# Patient Record
Sex: Male | Born: 1965 | Race: Black or African American | Hispanic: No | Marital: Married | Smoking: Never smoker
Health system: Southern US, Community
[De-identification: ages and names within clinical notes are randomized; demographics above are authoritative.]

## PROBLEM LIST (undated history)

## (undated) DIAGNOSIS — I219 Acute myocardial infarction, unspecified: Secondary | ICD-10-CM

## (undated) DIAGNOSIS — I2699 Other pulmonary embolism without acute cor pulmonale: Secondary | ICD-10-CM

## (undated) DIAGNOSIS — I82409 Acute embolism and thrombosis of unspecified deep veins of unspecified lower extremity: Secondary | ICD-10-CM

## (undated) DIAGNOSIS — E7800 Pure hypercholesterolemia, unspecified: Secondary | ICD-10-CM

## (undated) DIAGNOSIS — G4730 Sleep apnea, unspecified: Secondary | ICD-10-CM

## (undated) DIAGNOSIS — I10 Essential (primary) hypertension: Secondary | ICD-10-CM

## (undated) HISTORY — PX: TONSILLECTOMY: SUR1361

## (undated) HISTORY — PX: OTHER SURGICAL HISTORY: SHX169

## (undated) HISTORY — PX: IVC FILTER INSERTION: CATH118245

## (undated) HISTORY — PX: APPENDECTOMY: SHX54

---

## 2018-02-03 ENCOUNTER — Encounter: Payer: Self-pay | Admitting: Emergency Medicine

## 2018-02-03 ENCOUNTER — Emergency Department
Admission: EM | Admit: 2018-02-03 | Discharge: 2018-02-03 | Disposition: A | Discharge status: Home / Self Care | Payer: Federal, State, Local not specified - PPO | Attending: Emergency Medicine | Admitting: Emergency Medicine

## 2018-02-03 ENCOUNTER — Emergency Department: Payer: Federal, State, Local not specified - PPO

## 2018-02-03 ENCOUNTER — Other Ambulatory Visit: Payer: Self-pay

## 2018-02-03 DIAGNOSIS — R2241 Localized swelling, mass and lump, right lower limb: Secondary | ICD-10-CM | POA: Diagnosis not present

## 2018-02-03 DIAGNOSIS — Z7901 Long term (current) use of anticoagulants: Secondary | ICD-10-CM | POA: Insufficient documentation

## 2018-02-03 DIAGNOSIS — R112 Nausea with vomiting, unspecified: Secondary | ICD-10-CM

## 2018-02-03 DIAGNOSIS — R197 Diarrhea, unspecified: Secondary | ICD-10-CM | POA: Diagnosis not present

## 2018-02-03 DIAGNOSIS — Z79899 Other long term (current) drug therapy: Secondary | ICD-10-CM | POA: Insufficient documentation

## 2018-02-03 DIAGNOSIS — R1032 Left lower quadrant pain: Secondary | ICD-10-CM | POA: Insufficient documentation

## 2018-02-03 HISTORY — DX: Acute embolism and thrombosis of unspecified deep veins of unspecified lower extremity: I82.409

## 2018-02-03 LAB — URINALYSIS, COMPLETE (UACMP) WITH MICROSCOPIC
BILIRUBIN URINE: NEGATIVE
Bacteria, UA: NONE SEEN
GLUCOSE, UA: NEGATIVE mg/dL
KETONES UR: NEGATIVE mg/dL
LEUKOCYTES UA: NEGATIVE
NITRITE: NEGATIVE
PH: 5 (ref 5.0–8.0)
PROTEIN: 30 mg/dL — AB
SQUAMOUS EPITHELIAL / LPF: NONE SEEN
Specific Gravity, Urine: 1.012 (ref 1.005–1.030)
WBC, UA: NONE SEEN WBC/hpf (ref 0–5)

## 2018-02-03 LAB — COMPREHENSIVE METABOLIC PANEL
ALT: 17 U/L (ref 17–63)
AST: 29 U/L (ref 15–41)
Albumin: 4.1 g/dL (ref 3.5–5.0)
Alkaline Phosphatase: 120 U/L (ref 38–126)
Anion gap: 10 (ref 5–15)
BILIRUBIN TOTAL: 1.3 mg/dL — AB (ref 0.3–1.2)
BUN: 14 mg/dL (ref 6–20)
CHLORIDE: 109 mmol/L (ref 101–111)
CO2: 20 mmol/L — ABNORMAL LOW (ref 22–32)
CREATININE: 1.22 mg/dL (ref 0.61–1.24)
Calcium: 9.2 mg/dL (ref 8.9–10.3)
GFR calc Af Amer: >60 mL/min (ref >60)
Glucose, Bld: 122 mg/dL — ABNORMAL HIGH (ref 65–99)
Potassium: 3.6 mmol/L (ref 3.5–5.1)
Sodium: 139 mmol/L (ref 135–145)
TOTAL PROTEIN: 8.1 g/dL (ref 6.5–8.1)

## 2018-02-03 LAB — CBC
HCT: 46.6 % (ref 40.0–52.0)
Hemoglobin: 15.4 g/dL (ref 13.0–18.0)
MCH: 28.6 pg (ref 26.0–34.0)
MCHC: 33 g/dL (ref 32.0–36.0)
MCV: 86.7 fL (ref 80.0–100.0)
PLATELETS: 222 10*3/uL (ref 150–440)
RBC: 5.38 MIL/uL (ref 4.40–5.90)
RDW: 14.5 % (ref 11.5–14.5)
WBC: 7.8 10*3/uL (ref 3.8–10.6)

## 2018-02-03 LAB — LIPASE, BLOOD: LIPASE: 23 U/L (ref 11–51)

## 2018-02-03 LAB — TROPONIN I: Troponin I: <0.03 ng/mL (ref <0.03)

## 2018-02-03 MED ORDER — ONDANSETRON HCL 4 MG PO TABS: 4.0000 mg | ORAL_TABLET | Freq: Three times a day (TID) | ORAL | 0 refills | Status: AC | PRN

## 2018-02-03 MED ORDER — IOPAMIDOL (ISOVUE-300) INJECTION 61%
30.0000 mL | Freq: Once | INTRAVENOUS | Status: AC | PRN
  Administered 2018-02-03: 30 mL via ORAL

## 2018-02-03 MED ORDER — IOPAMIDOL (ISOVUE-300) INJECTION 61%
125.0000 mL | Freq: Once | INTRAVENOUS | Status: AC | PRN
  Administered 2018-02-03: 125 mL via INTRAVENOUS

## 2018-02-03 MED ORDER — SODIUM CHLORIDE 0.9 % IV BOLUS
1000.0000 mL | Freq: Once | INTRAVENOUS | Status: AC
  Administered 2018-02-03: 1000 mL via INTRAVENOUS

## 2018-02-03 MED ORDER — DICYCLOMINE HCL 10 MG PO CAPS: 10.0000 mg | ORAL_CAPSULE | Freq: Three times a day (TID) | ORAL | 0 refills | Status: DC | PRN

## 2018-02-03 MED ORDER — ONDANSETRON HCL 4 MG/2ML IJ SOLN
4.0000 mg | Freq: Once | INTRAMUSCULAR | Status: AC
  Administered 2018-02-03: 4 mg via INTRAVENOUS
  Filled 2018-02-03: qty 2

## 2018-02-03 MED ORDER — DICYCLOMINE HCL 10 MG PO CAPS
10.0000 mg | ORAL_CAPSULE | Freq: Once | ORAL | Status: AC
  Administered 2018-02-03: 10 mg via ORAL
  Filled 2018-02-03: qty 1

## 2018-02-03 NOTE — ED Notes (Signed)
Pt stuck x2 by this RN and pt's wife requesting pt have IV team come to stick pt and is refusing to allow any other nurses besides IV team to come stick patient.

## 2018-02-03 NOTE — ED Triage Notes (Signed)
Pt to c/o N/V/D since yesterday. Pt states that he doesn't have any control over his bowels and that he is unable to keep anything down. Pt in NAD at this time.

## 2018-02-03 NOTE — Discharge Instructions (Addendum)
You may take the nausea medicine and the Bentyl (for abdominal cramping) as needed over the next few days to help your symptoms.  Gradually advance your diet over the next few days.  Return to the emergency department for new, worsening, persistent severe vomiting, inability to tolerate anything by mouth, persistent diarrhea or abdominal pain, fevers, weakness, any blood in the stool, or any other new or worsening symptoms that concern you.  Your CT scan shows a small lesion in the L5 bone in your lower back which is most likely a hemangioma, in other words and abnormal blood vessel.  You should let your regular doctor know about this finding, so that you can have a follow-up scan to monitor it.

## 2018-02-03 NOTE — ED Notes (Signed)
Pt discharged to home.  Family member driving.  Discharge instructions reviewed.  Verbalized understanding.  No questions or concerns at this time.  Teach back verified.  Pt in NAD.  No items left in ED.   

## 2018-02-03 NOTE — ED Provider Notes (Signed)
[facility] Emergency Department Provider Note ____________________________________________   First MD Initiated Contact with Patient 02/03/18 1614     (approximate)  I have reviewed the triage vital signs and the nursing notes.   HISTORY  Chief Complaint Emesis and Diarrhea    HPI Benjamin Delacruz is a 52 y.o. male with past medical history of DVT who presents with diarrhea since yesterday, described as watery, multiple episodes, nonbloody, and associated with nausea and multiple episodes of vomiting.  Patient also reports some associated left-sided abdominal pain.  The patient incidentally also mentions that he has had some increased right leg swelling for the last few days.  He has not been able to take his Xarelto yesterday or today.  He reports mild shortness of breath but denies chest pain.  No sick contacts, recent hospitalization, or unusual foods.   Past Medical History:  Diagnosis Date  . DVT (deep venous thrombosis) ([place])     There are no active problems to display for this patient.   Past Surgical History:  Procedure Laterality Date  . IVC FILTER INSERTION      Prior to Admission medications   Medication Sig Start Date End Date Taking? Authorizing Provider  hydrochlorothiazide (HYDRODIURIL) 25 MG tablet Take 25 mg by mouth daily.   Yes [provider]  metoprolol tartrate (LOPRESSOR) 50 MG tablet Take 50 mg by mouth 2 (two) times daily. 12/23/14  Yes [provider]  rivaroxaban (XARELTO) 20 MG TABS tablet Take 20 mg by mouth daily. 07/16/17  Yes [provider]  dicyclomine (BENTYL) 10 MG capsule Take 1 capsule (10 mg total) by mouth 3 (three) times daily as needed for up to 4 days for spasms. 02/03/18 02/07/18  Arta Silence, MD  ondansetron (ZOFRAN) 4 MG tablet Take 1 tablet (4 mg total) by mouth every 8 (eight) hours as needed for up to 5 days for nausea or vomiting. 02/03/18 02/08/18  Arta Silence, MD     Allergies Patient has no known allergies.  No family history on file.  Social History Social History   Tobacco Use  . Smoking status: Never Smoker  . Smokeless tobacco: Never Used  Substance Use Topics  . Alcohol use: Not Currently  . Drug use: Not Currently    Review of Systems  Constitutional: Positive for chills. Eyes: No redness. ENT: No sore throat. Cardiovascular: Denies chest pain. Respiratory: Positive for mild shortness of breath. Gastrointestinal: Positive for nausea, vomiting, and diarrhea.  Genitourinary: Negative for dysuria.  Musculoskeletal: Negative for back pain. Skin: Negative for rash. Neurological: Negative for headache.   ____________________________________________   PHYSICAL EXAM:  VITAL SIGNS: ED Triage Vitals  Enc Vitals Group     BP 02/03/18 1525 (!) 147/83     Pulse Rate 02/03/18 1525 95     Resp 02/03/18 1525 16     Temp 02/03/18 1528 99.6 F (37.6 C)     Temp Source 02/03/18 1525 Oral     SpO2 02/03/18 1525 96 %     Weight 02/03/18 1526 245 lb (111.1 kg)     Height 02/03/18 1526 5\' 7"  (1.702 m)     Head Circumference --      Peak Flow --      Pain Score 02/03/18 1526 5     Pain Loc --      Pain Edu? --      Excl. in [place]? --     Constitutional: Alert and oriented.  Relatively well-appearing and in  no acute distress. Eyes: Conjunctivae are normal.  No scleral icterus.  Slightly dry Head: Atraumatic. Nose: No congestion/rhinnorhea. Mouth/Throat: Mucous membranes are moist.   Neck: Normal range of motion.  Cardiovascular: Normal rate, regular rhythm. Grossly normal heart sounds.  Good peripheral circulation. Respiratory: Normal respiratory effort.  No retractions. Lungs CTAB. Gastrointestinal: Soft with mild left mid abdominal discomfort but no focal tenderness. No distention.  Genitourinary: No flank tenderness. Musculoskeletal:  Extremities warm and well perfused.  Mild right lower leg tenderness to  palpation. Neurologic:  Normal speech and language. No gross focal neurologic deficits are appreciated.  Skin:  Skin is warm and dry. No rash noted. Psychiatric: Mood and affect are normal. Speech and behavior are normal.  ____________________________________________   LABS (all labs ordered are listed, but only abnormal results are displayed)  Labs Reviewed  COMPREHENSIVE METABOLIC PANEL - Abnormal; Notable for the following components:      Result Value   CO2 20 (*)    Glucose, Bld 122 (*)    Total Bilirubin 1.3 (*)    All other components within normal limits  URINALYSIS, COMPLETE (UACMP) WITH MICROSCOPIC - Abnormal; Notable for the following components:   Color, Urine YELLOW (*)    APPearance CLEAR (*)    Hgb urine dipstick SMALL (*)    Protein, ur 30 (*)    All other components within normal limits  LIPASE, BLOOD  CBC  TROPONIN I   ____________________________________________  EKG  ED ECG REPORT I, Arta Silence, the attending physician, personally viewed and interpreted this ECG.  Date: 02/03/2018 EKG Time: 1803 Rate: 92 Rhythm: normal sinus rhythm QRS Axis: Borderline left axis Intervals: normal ST/T Wave abnormalities: T wave inversions inferior and lateral Narrative Interpretation: Nonspecific T wave abnormality; no prior EKG available for comparison  ____________________________________________  RADIOLOGY  CXR: No focal infiltrate or other acute findings  [place] RLE: No acute DVT  CT abdomen: liquid stool within the colon; no colitis, diverticulitis, or other acute findings.  Lesion in L5 likely hemangioma.  ____________________________________________   PROCEDURES  Procedure(s) performed: No  Procedures  Critical Care performed: No ____________________________________________   INITIAL IMPRESSION / ASSESSMENT AND PLAN / ED COURSE  Pertinent labs & imaging results that were available during my care of the patient were reviewed by me and  considered in my medical decision making (see chart for details).  52 year old male with past medical history as noted above presents with nausea, vomiting, diarrhea with some left-sided abdominal pain since yesterday.  He also incidentally mentions increased right lower extremity swelling over the last several days where he previously has had DVT.  Patient is on Xarelto but was unable to take it yesterday or today due to the vomiting.  He reports mild shortness of breath but denies any chest pain.  Per prior records, he has an IVC filter in place.  On exam, the vital signs are normal except for low-grade temperature, the patient is relatively comfortable appearing, the abdomen is soft with minimal left-sided discomfort, and the remainder of the exam is as described above.  Overall presentation is most consistent with viral gastroenteritis versus foodborne illness.  Lower suspicion for colitis.  We will give IV fluids, symptomatic treatment, and obtain labs.  Consider imaging if patient continues to have abdominal pain or there are concerning lab findings.  Patient reports increased right lower leg swelling where he previously had DVT.  He has generally been compliant with Xarelto.  Will obtain a DVT study and a chest  x-ray.  There is no clinical evidence for PE given no hypoxia, tachycardia, or chest pain, especially given the patient has an IVC filter.   ----------------------------------------- 8:10 PM on 02/03/2018 -----------------------------------------  Patient's x-ray and DVT study are negative.  Lab workup is unremarkable and his electrolytes are normal.  His EKG shows nonspecific lateral T wave inversions (no old EKG for comparison), but troponin is negative, and there is no clinical evidence for ACS.  UA is also negative.  On repeat exam the patient did have some left lower quadrant tenderness, so we discussed the further plan and the patient agreed with obtaining a CT to rule out  colitis.  CT shows no concerning acute findings.  Patient's symptoms have improved.  He feels well to go home.  CT did show a small lesion in L5 consistent with likely hemangioma.  Patient has known metastatic cancer history.  He has no pain there.  This finding does not appear to be of any urgent clinical significance.  I informed him of the finding and recommended follow-up with his PMD.  Return precautions given, the patient expresses understanding.     ____________________________________________   FINAL CLINICAL IMPRESSION(S) / ED DIAGNOSES  Final diagnoses:  Nausea vomiting and diarrhea      NEW MEDICATIONS STARTED DURING THIS VISIT:  New Prescriptions   DICYCLOMINE (BENTYL) 10 MG CAPSULE    Take 1 capsule (10 mg total) by mouth 3 (three) times daily as needed for up to 4 days for spasms.   ONDANSETRON (ZOFRAN) 4 MG TABLET    Take 1 tablet (4 mg total) by mouth every 8 (eight) hours as needed for up to 5 days for nausea or vomiting.     Note:  This document was prepared using Dragon voice recognition software and may include unintentional dictation errors.    Arta Silence, MD 02/03/18 2017

## 2018-09-30 ENCOUNTER — Emergency Department: Payer: Federal, State, Local not specified - PPO

## 2018-09-30 ENCOUNTER — Other Ambulatory Visit: Payer: Self-pay

## 2018-09-30 ENCOUNTER — Emergency Department
Admission: EM | Admit: 2018-09-30 | Discharge: 2018-09-30 | Disposition: A | Discharge status: Home / Self Care | Payer: Federal, State, Local not specified - PPO | Attending: Emergency Medicine | Admitting: Emergency Medicine

## 2018-09-30 ENCOUNTER — Encounter: Payer: Self-pay | Admitting: *Deleted

## 2018-09-30 DIAGNOSIS — R42 Dizziness and giddiness: Secondary | ICD-10-CM | POA: Insufficient documentation

## 2018-09-30 DIAGNOSIS — Z7901 Long term (current) use of anticoagulants: Secondary | ICD-10-CM | POA: Insufficient documentation

## 2018-09-30 DIAGNOSIS — Z79899 Other long term (current) drug therapy: Secondary | ICD-10-CM | POA: Insufficient documentation

## 2018-09-30 DIAGNOSIS — R112 Nausea with vomiting, unspecified: Secondary | ICD-10-CM | POA: Insufficient documentation

## 2018-09-30 LAB — BASIC METABOLIC PANEL
ANION GAP: 5 (ref 5–15)
BUN: 11 mg/dL (ref 6–20)
CALCIUM: 9.2 mg/dL (ref 8.9–10.3)
CO2: 26 mmol/L (ref 22–32)
Chloride: 114 mmol/L — ABNORMAL HIGH (ref 98–111)
Creatinine, Ser: 1.07 mg/dL (ref 0.61–1.24)
Glucose, Bld: 115 mg/dL — ABNORMAL HIGH (ref 70–99)
Potassium: 4.1 mmol/L (ref 3.5–5.1)
Sodium: 145 mmol/L (ref 135–145)

## 2018-09-30 LAB — CBC
HCT: 42.8 % (ref 39.0–52.0)
Hemoglobin: 14.6 g/dL (ref 13.0–17.0)
MCH: 29.3 pg (ref 26.0–34.0)
MCHC: 34.1 g/dL (ref 30.0–36.0)
MCV: 85.9 fL (ref 80.0–100.0)
NRBC: 0 % (ref 0.0–0.2)
PLATELETS: 237 10*3/uL (ref 150–400)
RBC: 4.98 MIL/uL (ref 4.22–5.81)
RDW: 13.2 % (ref 11.5–15.5)
WBC: 8.5 10*3/uL (ref 4.0–10.5)

## 2018-09-30 LAB — PROTIME-INR
INR: 0.97
PROTHROMBIN TIME: 12.8 s (ref 11.4–15.2)

## 2018-09-30 LAB — TROPONIN I

## 2018-09-30 MED ORDER — ONDANSETRON HCL 4 MG/2ML IJ SOLN
4.0000 mg | Freq: Once | INTRAMUSCULAR | Status: AC
  Administered 2018-09-30: 4 mg via INTRAVENOUS
  Filled 2018-09-30: qty 2

## 2018-09-30 MED ORDER — PROCHLORPERAZINE EDISYLATE 10 MG/2ML IJ SOLN
10.0000 mg | Freq: Once | INTRAMUSCULAR | Status: AC
  Administered 2018-09-30: 10 mg via INTRAVENOUS
  Filled 2018-09-30: qty 2

## 2018-09-30 MED ORDER — MECLIZINE HCL 25 MG PO TABS: 25.0000 mg | ORAL_TABLET | Freq: Three times a day (TID) | ORAL | 0 refills | Status: DC | PRN

## 2018-09-30 NOTE — ED Notes (Signed)
Pt to triage via wheelchair.  Pt reports dizziness and nausea.  Sx for 3 days.  States sx worse today.  No chest pain or sob.  Pt alert  Speech clear.

## 2018-09-30 NOTE — ED Provider Notes (Signed)
Evaluated at the bedside.  Patient with positional vertigo.  Now absent after Compazine.  Reassuring work-up.  Likely peripheral vertigo.  Agree with NP Triplett's assessment and plan.   Orbie Pyo, MD 09/30/18 2049

## 2018-09-30 NOTE — ED Notes (Signed)
E signature pad not working. Pt verbalized understanding of discharge instructions.

## 2018-09-30 NOTE — ED Provider Notes (Signed)
[facility] Emergency Department Provider Note  ____________________________________________   None    (approximate)  I have reviewed the triage vital signs and the nursing notes.   HISTORY  Chief Complaint Dizziness   HPI Benjamin Delacruz is a 52 y.o. male who presents to the emergency department for treatment and evaluation of dizziness and nausea for the past 3 days. Symptoms are worse today. He denies chest pain or shortness of breath. He has a history of DVT, otherwise healthy. He denies recent illness. Wife gave him a meclizine a couple of days ago which he believes helped, but dizziness returned upon awakening. Dizziness occurs with position change and he feels that the room is spinning.    Past Medical History:  Diagnosis Date  . DVT (deep venous thrombosis) ([place])     There are no active problems to display for this patient.   Past Surgical History:  Procedure Laterality Date  . IVC FILTER INSERTION      Prior to Admission medications   Medication Sig Start Date End Date Taking? Authorizing Provider  dicyclomine (BENTYL) 10 MG capsule Take 1 capsule (10 mg total) by mouth 3 (three) times daily as needed for up to 4 days for spasms. 02/03/18 02/07/18  Arta Silence, MD  hydrochlorothiazide (HYDRODIURIL) 25 MG tablet Take 25 mg by mouth daily.    [provider]  meclizine (MEDI-MECLIZINE) 25 MG tablet Take 1 tablet (25 mg total) by mouth 3 (three) times daily as needed for dizziness. 09/30/18   Amoria Mclees, Johnette Abraham B, FNP  metoprolol tartrate (LOPRESSOR) 50 MG tablet Take 50 mg by mouth 2 (two) times daily. 12/23/14   [provider]  rivaroxaban (XARELTO) 20 MG TABS tablet Take 20 mg by mouth daily. 07/16/17   [provider]    Allergies Patient has no known allergies.  No family history on file.  Social History Social History   Tobacco Use  . Smoking status: Never Smoker  . Smokeless tobacco: Never Used    Substance Use Topics  . Alcohol use: Not Currently  . Drug use: Not Currently    Review of Systems  Constitutional: No fever/chills Eyes: No visual changes. ENT: No sore throat. Cardiovascular: Denies chest pain. Respiratory: Denies shortness of breath. Gastrointestinal: No abdominal pain.  No nausea, no vomiting.  No diarrhea. Genitourinary: Negative for dysuria. Musculoskeletal: Negative for back pain. Skin: Negative for rash. Neurological: Negative for headaches, focal weakness or numbness. Positive for dizziness. ____________________________________________   PHYSICAL EXAM:  VITAL SIGNS: ED Triage Vitals  Enc Vitals Group     BP 09/30/18 1708 (!) 142/87     Pulse Rate 09/30/18 1708 73     Resp 09/30/18 1708 20     Temp 09/30/18 1708 98.6 F (37 C)     Temp Source 09/30/18 1708 Oral     SpO2 09/30/18 1708 99 %     Weight 09/30/18 1714 235 lb (106.6 kg)     Height 09/30/18 1714 5\' 7"  (1.702 m)     Head Circumference --      Peak Flow --      Pain Score --      Pain Loc --      Pain Edu? --      Excl. in [place]? --     Constitutional: Alert and oriented. Well appearing and in no acute distress. Eyes: Conjunctivae are normal. PERRL. EOMI. Head: Atraumatic. Nose: No congestion/rhinnorhea. Mouth/Throat: Mucous membranes are moist.  Oropharynx non-erythematous. Neck: No  stridor.   Cardiovascular: Normal rate, regular rhythm. Grossly normal heart sounds.  Good peripheral circulation. Respiratory: Normal respiratory effort.  No retractions. Lungs CTAB. Gastrointestinal: Soft and nontender. No distention. No abdominal bruits. No CVA tenderness. Musculoskeletal: No lower extremity tenderness nor edema.  No joint effusions. Neurologic:  Normal speech and language. No gross focal neurologic deficits are appreciated. No gait instability. No pronator drift. Observed ambulating with unassisted, steady gait. Skin:  Skin is warm, dry and intact. No rash noted. Psychiatric: Mood  and affect are normal. Speech and behavior are normal.  ____________________________________________   LABS (all labs ordered are listed, but only abnormal results are displayed)  Labs Reviewed  BASIC METABOLIC PANEL - Abnormal; Notable for the following components:      Result Value   Chloride 114 (*)    Glucose, Bld 115 (*)    All other components within normal limits  CBC  TROPONIN I  PROTIME-INR   ____________________________________________  EKG  ED ECG REPORT I, Brynnlee Cumpian, FNP-BC, personally viewed and interpreted this ECG.   Date: 09/30/2018  EKG Time: 1713  Rate: 74  Rhythm: normal EKG, normal sinus rhythm  Axis: left  Intervals:none  ST&T Change: No ST elevation  ____________________________________________  RADIOLOGY  ED MD interpretation:  CT head negative for acute findings.  Official radiology report(s): Dg Chest 2 View  Result Date: 09/30/2018 CLINICAL DATA:  Dizziness. Shortness of breath. Vomiting and nausea over the past 4 days. EXAM: CHEST - 2 VIEW COMPARISON:  Two-view chest x-ray 02/03/2018 FINDINGS: Heart size is normal. Lung volumes are low. There is mild edema superimposed on chronic interstitial coarsening. No significant airspace consolidation is present. Mild bibasilar atelectasis is present. No significant effusions are evident. Degenerative changes in the upper thoracic spine are stable. IMPRESSION: 1. Mild edema superimposed on chronic airspace disease. This could represent early congestive heart failure. 2. No focal airspace disease. Electronically Signed   By: San Morelle M.D.   On: 09/30/2018 18:12   Ct Head Wo Contrast  Result Date: 09/30/2018 CLINICAL DATA:  Vertigo, episodic, peripheral. Dizziness and nausea. Symptoms for 3 days. EXAM: CT HEAD WITHOUT CONTRAST TECHNIQUE: Contiguous axial images were obtained from the base of the skull through the vertex without intravenous contrast. COMPARISON:  None. FINDINGS: Brain: No  acute infarct, hemorrhage, or mass lesion is present. The ventricles are of normal size. No significant extraaxial fluid collection is present. The brainstem and cerebellum are normal. Vascular: No hyperdense vessel or unexpected calcification. Skull: Calvarium is intact. No focal lytic or blastic lesions are present. Sinuses/Orbits: The paranasal sinuses and mastoid air cells are clear. Globes and orbits are within normal limits. IMPRESSION: Negative CT of the head. Electronically Signed   By: San Morelle M.D.   On: 09/30/2018 20:12    ____________________________________________   PROCEDURES  Procedure(s) performed: None  Procedures  Critical Care performed: No  ____________________________________________   INITIAL IMPRESSION / ASSESSMENT AND PLAN / ED COURSE  As part of my medical decision making, I reviewed the following data within the electronic MEDICAL RECORD NUMBER    52 year old male presenting to the emergency department for treatment and evaluation of dizziness with nausea and vomiting.  Symptoms started 3 days ago and have progressively worsened.  Patient describes symptoms as room spinning with any position change.  While here in the emergency department, he was given Zofran without any relief of the nausea and vomiting.  He was then given Compazine with significant improvement of the dizziness and resolution  of the nausea and vomiting.  Once he felt better, he was requesting discharge.  CT scan of the head is normal and labs are all reassuring, therefore this seems to be a reasonable plan.  He will be given a prescription for meclizine and will follow-up with his primary care provider if symptoms return. He is to return to the ER for symptoms that change or worsen.       ____________________________________________   FINAL CLINICAL IMPRESSION(S) / ED DIAGNOSES  Final diagnoses:  Dizziness     ED Discharge Orders         Ordered    meclizine (MEDI-MECLIZINE)  25 MG tablet  3 times daily PRN     09/30/18 2034           Note:  This document was prepared using Dragon voice recognition software and may include unintentional dictation errors.    Victorino Dike, FNP 10/01/18 0201    Orbie Pyo, MD 10/08/18 2223

## 2018-09-30 NOTE — Discharge Instructions (Signed)
Please follow up with your primary care provider if not improving over the next few days.  Return to the ER for symptoms that change or worsen if unable to schedule an appointment.

## 2019-03-11 ENCOUNTER — Emergency Department
Admission: EM | Admit: 2019-03-11 | Discharge: 2019-03-11 | Disposition: A | Discharge status: Home / Self Care | Payer: Federal, State, Local not specified - PPO | Attending: Emergency Medicine | Admitting: Emergency Medicine

## 2019-03-11 ENCOUNTER — Encounter: Payer: Self-pay | Admitting: Radiology

## 2019-03-11 ENCOUNTER — Emergency Department: Payer: Federal, State, Local not specified - PPO

## 2019-03-11 ENCOUNTER — Other Ambulatory Visit: Payer: Self-pay

## 2019-03-11 DIAGNOSIS — Z7901 Long term (current) use of anticoagulants: Secondary | ICD-10-CM | POA: Insufficient documentation

## 2019-03-11 DIAGNOSIS — R0789 Other chest pain: Secondary | ICD-10-CM | POA: Diagnosis present

## 2019-03-11 DIAGNOSIS — Z79899 Other long term (current) drug therapy: Secondary | ICD-10-CM | POA: Insufficient documentation

## 2019-03-11 LAB — COMPREHENSIVE METABOLIC PANEL
ALT: 13 U/L (ref 0–44)
AST: 23 U/L (ref 15–41)
Albumin: 4.1 g/dL (ref 3.5–5.0)
Alkaline Phosphatase: 117 U/L (ref 38–126)
Anion gap: 8 (ref 5–15)
BUN: 13 mg/dL (ref 6–20)
CO2: 24 mmol/L (ref 22–32)
Calcium: 9 mg/dL (ref 8.9–10.3)
Chloride: 106 mmol/L (ref 98–111)
Creatinine, Ser: 1.21 mg/dL (ref 0.61–1.24)
GFR calc Af Amer: >60 mL/min (ref >60)
GFR calc non Af Amer: >60 mL/min (ref >60)
Glucose, Bld: 122 mg/dL — ABNORMAL HIGH (ref 70–99)
Potassium: 4.2 mmol/L (ref 3.5–5.1)
Sodium: 138 mmol/L (ref 135–145)
Total Bilirubin: 0.8 mg/dL (ref 0.3–1.2)
Total Protein: 7.5 g/dL (ref 6.5–8.1)

## 2019-03-11 LAB — CBC
HCT: 43.8 % (ref 39.0–52.0)
Hemoglobin: 14.6 g/dL (ref 13.0–17.0)
MCH: 28.8 pg (ref 26.0–34.0)
MCHC: 33.3 g/dL (ref 30.0–36.0)
MCV: 86.4 fL (ref 80.0–100.0)
Platelets: 222 10*3/uL (ref 150–400)
RBC: 5.07 MIL/uL (ref 4.22–5.81)
RDW: 13.2 % (ref 11.5–15.5)
WBC: 7.8 10*3/uL (ref 4.0–10.5)
nRBC: 0 % (ref 0.0–0.2)

## 2019-03-11 LAB — TROPONIN I: Troponin I: <0.03 ng/mL (ref <0.03)

## 2019-03-11 MED ORDER — ONDANSETRON HCL 4 MG/2ML IJ SOLN
4.0000 mg | Freq: Once | INTRAMUSCULAR | Status: AC
  Administered 2019-03-11: 17:00:00 4 mg via INTRAVENOUS
  Filled 2019-03-11: qty 2

## 2019-03-11 MED ORDER — DIPHENHYDRAMINE HCL 50 MG/ML IJ SOLN
50.0000 mg | Freq: Once | INTRAMUSCULAR | Status: AC
  Administered 2019-03-11: 50 mg via INTRAVENOUS
  Filled 2019-03-11: qty 1

## 2019-03-11 MED ORDER — HYDROCORTISONE NA SUCCINATE PF 100 MG IJ SOLR
200.0000 mg | Freq: Once | INTRAMUSCULAR | Status: AC
  Administered 2019-03-11: 200 mg via INTRAVENOUS
  Filled 2019-03-11: qty 4

## 2019-03-11 MED ORDER — IOHEXOL 350 MG/ML SOLN
75.0000 mL | Freq: Once | INTRAVENOUS | Status: AC | PRN
  Administered 2019-03-11: 75 mL via INTRAVENOUS

## 2019-03-11 NOTE — ED Triage Notes (Signed)
Pt to ED via EMS from home with c/o substernal chest pain. Pt has hx of blood clots and stent placement. Pt states he currently has known blood clots. Pt describes pain as chest tightness with dizziness, Pt took 1/2 nitro at home prior to EMS arrival. NAD.

## 2019-03-11 NOTE — ED Provider Notes (Signed)
[facility] Emergency Department Provider Note  Time seen: 5:17 PM  I have reviewed the triage vital signs and the nursing notes.   HISTORY  Chief Complaint Chest Pain   HPI Benjamin Delacruz is a 53 y.o. 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  History of PE in the past as well.  Patient states over the past 4 days he has been experiencing central left-sided chest pain sharp in nature worse with deep inspiration.  Patient states it feels somewhat similar to when he had a blood clot previously.  Patient also has a cardiac stent placed 8 years ago.  Denies any nausea or diaphoresis.  No shortness of breath cough or fever.  Overall patient appears very well at this time no distress.   Past Medical History:  Diagnosis Date  . DVT (deep venous thrombosis) ([place])     There are no active problems to display for this patient.   Past Surgical History:  Procedure Laterality Date  . IVC FILTER INSERTION      Prior to Admission medications   Medication Sig Start Date End Date Taking? Authorizing Provider  dicyclomine (BENTYL) 10 MG capsule Take 1 capsule (10 mg total) by mouth 3 (three) times daily as needed for up to 4 days for spasms. 02/03/18 02/07/18  Arta Silence, MD  hydrochlorothiazide (HYDRODIURIL) 25 MG tablet Take 25 mg by mouth daily.    [provider]  meclizine (MEDI-MECLIZINE) 25 MG tablet Take 1 tablet (25 mg total) by mouth 3 (three) times daily as needed for dizziness. 09/30/18   Triplett, Johnette Abraham B, FNP  metoprolol tartrate (LOPRESSOR) 50 MG tablet Take 50 mg by mouth 2 (two) times daily. 12/23/14   [provider]  rivaroxaban (XARELTO) 20 MG TABS tablet Take 20 mg by mouth daily. 07/16/17    [provider]    No Known Allergies  No family history on file.  Social History Social History   Tobacco Use  . Smoking status: Never Smoker  . Smokeless tobacco: Never Used  Substance Use Topics  . Alcohol use: Not Currently  . Drug use: Not Currently    Review of Systems Constitutional: Negative for fever. ENT: Negative for recent illness/congestion Cardiovascular: Intermittent central left-sided sharp chest pain worse with deep inspiration. Respiratory: Negative for shortness of breath.  Negative for cough. Gastrointestinal: Negative for abdominal pain, vomiting Musculoskeletal: Negative for musculoskeletal complaints Skin: Negative for skin complaints  Neurological: Negative for headache All other ROS negative  ____________________________________________   PHYSICAL EXAM:  VITAL SIGNS: ED Triage Vitals  Enc Vitals Group     BP 03/11/19 1714 137/85     Pulse Rate 03/11/19 1713 79     Resp 03/11/19 1714 16     Temp 03/11/19 1713 97.7 F (36.5 C)     Temp Source 03/11/19 1713 Oral     SpO2 03/11/19 1713 99 %     Weight 03/11/19 1711 243 lb (110.2 kg)     Height 03/11/19 1711 5\' 7"  (1.702 m)     Head Circumference --      Peak Flow --      Pain Score 03/11/19 1712 8     Pain Loc --  Pain Edu? --      Excl. in [place]? --    Constitutional: Alert and oriented. Well appearing and in no distress. Eyes: Normal exam ENT      Head: Normocephalic and atraumatic      Mouth/Throat: Mucous membranes are moist. Cardiovascular: Normal rate, regular rhythm.  Respiratory: Normal respiratory effort without tachypnea nor retractions. Breath sounds are clear Gastrointestinal: Soft and nontender. No distention.  Obese. Musculoskeletal: Nontender with normal range of motion in all extremities. No lower extremity tenderness or edema. Neurologic:  Normal speech and language. No gross focal neurologic deficits Skin:  Skin is warm, dry and intact.  Psychiatric: Mood  and affect are normal.  ____________________________________________    EKG  EKG viewed and interpreted by myself shows normal sinus rhythm at 79 bpm with a narrow QRS, normal axis, normal intervals, nonspecific but no concerning ST changes.  ____________________________________________    RADIOLOGY  CT scan of the chest is negative for PE.  ____________________________________________   INITIAL IMPRESSION / ASSESSMENT AND PLAN / ED COURSE  Pertinent labs & imaging results that were available during my care of the patient were reviewed by me and considered in my medical decision making (see chart for details).   Patient presents to the emergency department for chest pain.  States intermittent chest pain x4 days worse with deep inspiration, similar to prior PE.  Patient is on Xarelto 20 mg daily.  Denies missing any doses.  Patient denies any cough congestion or fever.  We will check labs, EKG, CTA of the chest to rule out PE.  Patient does state last 2 times he had CTAs of the chest he got very nauseated with the contrast.  We will dose hydrocortisone, Benadryl and Zofran 1 hour prior to CT imaging as a precaution.  Denies any shortness of breath hives or itching.  CTA negative for PE.  Lab work is reassuring.  Negative troponin.  Overall patient appears well, reassuring work-up and I believe the patient safe for discharge home with PCP follow-up.  Benjamin Delacruz was evaluated in Emergency Department on 03/11/2019 for the symptoms described in the history of present illness. He was evaluated in the context of the global COVID-19 pandemic, which necessitated consideration that the patient might be at risk for infection with the SARS-CoV-2 virus that causes COVID-19. Institutional protocols and algorithms that pertain to the evaluation of patients at risk for COVID-19 are in a state of rapid change based on information released by regulatory bodies including the CDC and federal and state  organizations. These policies and algorithms were followed during the patient's care in the ED.  ____________________________________________   FINAL CLINICAL IMPRESSION(S) / ED DIAGNOSES  Chest pain   Harvest Dark, MD 03/11/19 2045

## 2019-03-11 NOTE — ED Notes (Signed)
Pt in CT.

## 2019-03-11 NOTE — ED Notes (Signed)
Pt verbalized understanding of d/c instructions, and f/u care. No further questions at this time. Pt ambulatory to the exit with steady gait

## 2020-02-16 ENCOUNTER — Emergency Department
Admission: EM | Admit: 2020-02-16 | Discharge: 2020-02-16 | Disposition: A | Discharge status: Home / Self Care | Payer: Medicare Other | Attending: Emergency Medicine | Admitting: Emergency Medicine

## 2020-02-16 ENCOUNTER — Other Ambulatory Visit: Payer: Self-pay

## 2020-02-16 ENCOUNTER — Encounter: Payer: Self-pay | Admitting: Emergency Medicine

## 2020-02-16 DIAGNOSIS — Z7901 Long term (current) use of anticoagulants: Secondary | ICD-10-CM | POA: Diagnosis not present

## 2020-02-16 DIAGNOSIS — R42 Dizziness and giddiness: Secondary | ICD-10-CM | POA: Diagnosis present

## 2020-02-16 DIAGNOSIS — Z79899 Other long term (current) drug therapy: Secondary | ICD-10-CM | POA: Diagnosis not present

## 2020-02-16 DIAGNOSIS — I252 Old myocardial infarction: Secondary | ICD-10-CM | POA: Insufficient documentation

## 2020-02-16 HISTORY — DX: Acute myocardial infarction, unspecified: I21.9

## 2020-02-16 LAB — COMPREHENSIVE METABOLIC PANEL
ALT: 12 U/L (ref 0–44)
AST: 21 U/L (ref 15–41)
Albumin: 4 g/dL (ref 3.5–5.0)
Alkaline Phosphatase: 95 U/L (ref 38–126)
Anion gap: 8 (ref 5–15)
BUN: 12 mg/dL (ref 6–20)
CO2: 22 mmol/L (ref 22–32)
Calcium: 9.1 mg/dL (ref 8.9–10.3)
Chloride: 108 mmol/L (ref 98–111)
Creatinine, Ser: 1.24 mg/dL (ref 0.61–1.24)
GFR calc Af Amer: >60 mL/min (ref >60)
GFR calc non Af Amer: >60 mL/min (ref >60)
Glucose, Bld: 111 mg/dL — ABNORMAL HIGH (ref 70–99)
Potassium: 3.7 mmol/L (ref 3.5–5.1)
Sodium: 138 mmol/L (ref 135–145)
Total Bilirubin: 1.3 mg/dL — ABNORMAL HIGH (ref 0.3–1.2)
Total Protein: 7.7 g/dL (ref 6.5–8.1)

## 2020-02-16 LAB — URINALYSIS, COMPLETE (UACMP) WITH MICROSCOPIC
Bacteria, UA: NONE SEEN
Bilirubin Urine: NEGATIVE
Glucose, UA: NEGATIVE mg/dL
Hgb urine dipstick: NEGATIVE
Ketones, ur: NEGATIVE mg/dL
Leukocytes,Ua: NEGATIVE
Nitrite: NEGATIVE
Protein, ur: NEGATIVE mg/dL
Specific Gravity, Urine: 1.016 (ref 1.005–1.030)
Squamous Epithelial / LPF: NONE SEEN (ref 0–5)
pH: 5 (ref 5.0–8.0)

## 2020-02-16 LAB — CBC
HCT: 42 % (ref 39.0–52.0)
Hemoglobin: 14 g/dL (ref 13.0–17.0)
MCH: 29.1 pg (ref 26.0–34.0)
MCHC: 33.3 g/dL (ref 30.0–36.0)
MCV: 87.3 fL (ref 80.0–100.0)
Platelets: 211 10*3/uL (ref 150–400)
RBC: 4.81 MIL/uL (ref 4.22–5.81)
RDW: 13.2 % (ref 11.5–15.5)
WBC: 7.1 10*3/uL (ref 4.0–10.5)
nRBC: 0 % (ref 0.0–0.2)

## 2020-02-16 LAB — TROPONIN I (HIGH SENSITIVITY): Troponin I (High Sensitivity): 3 ng/L (ref <18)

## 2020-02-16 MED ORDER — SODIUM CHLORIDE 0.9 % IV BOLUS
1000.0000 mL | Freq: Once | INTRAVENOUS | Status: AC
  Administered 2020-02-16: 1000 mL via INTRAVENOUS

## 2020-02-16 NOTE — ED Triage Notes (Addendum)
Pt presents to ED with dizziness for the past week. Pt states symptoms worsen when standing. Lying down does not her symptoms improve. Reports some nausea but no vomiting. Reports similar symptoms previously when he had a heart attack about 10 years ago. Pt also reports increase in swelling and pain to right leg for several weeks. Hx of DVT. Denies sob and chest pain.

## 2020-02-16 NOTE — ED Provider Notes (Signed)
[facility] Emergency Department Provider Note  Time seen: 7:59 AM  I have reviewed the triage vital signs and the nursing notes.   HISTORY  Chief Complaint Dizziness   HPI Benjamin Delacruz is a 54 y.o. male with a past medical history of DVT, prior MI, on Xarelto who presents to the emergency department for dizziness.  According to the patient for the past 1 week or so he has been feeling intermittently dizzy, worse when he is standing or walking around.  Denies any chest pain or shortness of breath.  No headache.  No fever no shortness of breath.  Does state an occasional dry cough which he relates to pollen allergies.   Patient states dizziness was somewhat worse this morning so he came to the emergency department for evaluation.  Describes the dizziness more as a lightheaded or off-balance sensation.  Past Medical History:  Diagnosis Date  . DVT (deep venous thrombosis) ([place])   . MI (myocardial infarction) ([place])     There are no problems to display for this patient.   Past Surgical History:  Procedure Laterality Date  . IVC FILTER INSERTION      Prior to Admission medications   Medication Sig Start Date End Date Taking? Authorizing Provider  carvedilol (COREG) 3.125 MG tablet Take 3.125 mg by mouth 2 (two) times daily with a meal.    [provider]  furosemide (LASIX) 20 MG tablet Take 20 mg by mouth daily.    [provider]  ipratropium (ATROVENT HFA) 17 MCG/ACT inhaler Inhale 2 puffs into the lungs every 6 (six) hours as needed for wheezing.     [provider]  meclizine (MEDI-MECLIZINE) 25 MG tablet Take 1 tablet (25 mg total) by mouth 3 (three) times daily as needed for dizziness. Patient not taking: Reported on 03/11/2019 09/30/18   Benjamin Dike, FNP  omeprazole (PRILOSEC) 40 MG capsule Take 40 mg by mouth 2 (two) times daily.    [provider]  rivaroxaban (XARELTO) 20 MG TABS tablet Take 20 mg by mouth daily.  07/16/17   [provider]  simvastatin (ZOCOR) 20 MG tablet Take 20 mg by mouth at bedtime.    [provider]    No Known Allergies  No family history on file.  Social History Social History   Tobacco Use  . Smoking status: Never Smoker  . Smokeless tobacco: Never Used  Substance Use Topics  . Alcohol use: Not Currently  . Drug use: Not Currently    Review of Systems Constitutional: Negative for fever. Cardiovascular: Negative for chest pain. Respiratory: Negative for shortness of breath. Gastrointestinal: Negative for abdominal pain Musculoskeletal: Negative for musculoskeletal complaints Skin: Negative for skin complaints  Neurological: Negative for headache All other ROS negative  ____________________________________________   PHYSICAL EXAM:  VITAL SIGNS: ED Triage Vitals  Enc Vitals Group     BP 02/16/20 0722 (!) 151/89     Pulse Rate 02/16/20 0722 64     Resp 02/16/20 0722 18     Temp 02/16/20 0722 98.4 F (36.9 C)     Temp Source 02/16/20 0722 Oral     SpO2 02/16/20 0722 99 %     Weight 02/16/20 0723 246 lb (111.6 kg)     Height 02/16/20 0723 5\' 7"  (1.702 m)     Head Circumference --      Peak Flow --      Pain Score 02/16/20 0723 0     Pain Loc --  Pain Edu? --      Excl. in [place]? --     Constitutional: Alert and oriented. Well appearing and in no distress. Eyes: Normal exam ENT      Head: Normocephalic and atraumatic.      Mouth/Throat: Mucous membranes are moist. Cardiovascular: Normal rate, regular rhythm. No murmur Respiratory: Normal respiratory effort without tachypnea nor retractions. Breath sounds are clear Gastrointestinal: Soft and nontender. No distention.  Musculoskeletal: Nontender with normal range of motion in all extremities.  Neurologic:  Normal speech and language. No gross focal neurologic deficits Skin:  Skin is warm, dry and intact.  Psychiatric: Mood and affect are  normal.  ____________________________________________    EKG  EKG viewed and interpreted by myself shows a normal sinus rhythm at 69 bpm with a narrow QRS, normal axis, normal intervals, no concerning ST changes  ____________________________________________    INITIAL IMPRESSION / ASSESSMENT AND PLAN / ED COURSE  Pertinent labs & imaging results that were available during my care of the patient were reviewed by me and considered in my medical decision making (see chart for details).   Patient presents to the emergency department for dizziness ongoing over the past 1 week.  Patient does report dizziness approximately 10 years ago when he had an MI.  However patient denies any chest pain at this time denies any shortness of breath.  Overall the patient appears well.  We will check labs including cardiac enzymes, urinalysis, IV hydrate and continue to closely monitor.  Patient's work-up is essentially negative.  Reassuring lab work, negative cardiac enzymes.  Reassuring EKG.  Patient is feeling well currently.  He feels reassured, he will follow-up with his doctor.  Benjamin Delacruz was evaluated in Emergency Department on 02/16/2020 for the symptoms described in the history of present illness. He was evaluated in the context of the global COVID-19 pandemic, which necessitated consideration that the patient might be at risk for infection with the SARS-CoV-2 virus that causes COVID-19. Institutional protocols and algorithms that pertain to the evaluation of patients at risk for COVID-19 are in a state of rapid change based on information released by regulatory bodies including the CDC and federal and state organizations. These policies and algorithms were followed during the patient's care in the ED.  ____________________________________________   FINAL CLINICAL IMPRESSION(S) / ED DIAGNOSES  Dizziness   Harvest Dark, MD 02/16/20 [phone]

## 2020-02-16 NOTE — ED Notes (Signed)
Worsening dizziness with movement over last week. Known blood clot to RLE, taking Xarelto, has missed X 3 days due to needing refill on prescription. Missed BP medication this AM. No falls from dizziness. RLE "swelling going on for some time", reports months. Pain to RLE which is new over the last week. Multiple blood clots in hx.

## 2020-02-16 NOTE — ED Notes (Signed)
Pt alert and oriented X 4, stable for discharge. RR even and unlabored, color WNL. Discussed discharge instructions and follow up when appropriate. Instructed to follow up with ER for any life threatening symptoms or concerns that patient or family of patient may have  

## 2020-05-23 ENCOUNTER — Other Ambulatory Visit: Payer: Self-pay

## 2020-05-23 ENCOUNTER — Emergency Department: Payer: Medicare Other

## 2020-05-23 ENCOUNTER — Encounter: Payer: Self-pay | Admitting: Emergency Medicine

## 2020-05-23 DIAGNOSIS — R2241 Localized swelling, mass and lump, right lower limb: Secondary | ICD-10-CM | POA: Diagnosis not present

## 2020-05-23 DIAGNOSIS — Z7901 Long term (current) use of anticoagulants: Secondary | ICD-10-CM | POA: Diagnosis not present

## 2020-05-23 DIAGNOSIS — R0789 Other chest pain: Secondary | ICD-10-CM | POA: Diagnosis not present

## 2020-05-23 LAB — TROPONIN I (HIGH SENSITIVITY)
Troponin I (High Sensitivity): 3 ng/L (ref <18)
Troponin I (High Sensitivity): 3 ng/L (ref <18)

## 2020-05-23 LAB — CBC
HCT: 42 % (ref 39.0–52.0)
Hemoglobin: 14 g/dL (ref 13.0–17.0)
MCH: 29 pg (ref 26.0–34.0)
MCHC: 33.3 g/dL (ref 30.0–36.0)
MCV: 87.1 fL (ref 80.0–100.0)
Platelets: 232 10*3/uL (ref 150–400)
RBC: 4.82 MIL/uL (ref 4.22–5.81)
RDW: 13.1 % (ref 11.5–15.5)
WBC: 9.8 10*3/uL (ref 4.0–10.5)
nRBC: 0 % (ref 0.0–0.2)

## 2020-05-23 LAB — BASIC METABOLIC PANEL
Anion gap: 6 (ref 5–15)
BUN: 13 mg/dL (ref 6–20)
CO2: 24 mmol/L (ref 22–32)
Calcium: 9.5 mg/dL (ref 8.9–10.3)
Chloride: 109 mmol/L (ref 98–111)
Creatinine, Ser: 1.25 mg/dL — ABNORMAL HIGH (ref 0.61–1.24)
GFR calc Af Amer: >60 mL/min (ref >60)
GFR calc non Af Amer: >60 mL/min (ref >60)
Glucose, Bld: 78 mg/dL (ref 70–99)
Potassium: 3.8 mmol/L (ref 3.5–5.1)
Sodium: 139 mmol/L (ref 135–145)

## 2020-05-23 MED ORDER — IOHEXOL 350 MG/ML SOLN
75.0000 mL | Freq: Once | INTRAVENOUS | Status: AC | PRN
  Administered 2020-05-23: 75 mL via INTRAVENOUS

## 2020-05-23 NOTE — ED Triage Notes (Signed)
Here for right leg pain that is chronic but worse than normal.  Has had swelling to right calf he is unable to control with compression sock.  Hx dvt, on xarelto. Reports has developed clots while on xarelto.  Also hx of PE with IVC filter but reports has had a clot get past his filter.  Hx of MI.  Having SHOB and right side CP today as well as mid right back pain.   VSS at this time.

## 2020-05-24 ENCOUNTER — Emergency Department
Admission: EM | Admit: 2020-05-24 | Discharge: 2020-05-24 | Disposition: A | Discharge status: Home / Self Care | Payer: Medicare Other | Attending: Emergency Medicine | Admitting: Emergency Medicine

## 2020-05-24 DIAGNOSIS — M7989 Other specified soft tissue disorders: Secondary | ICD-10-CM

## 2020-05-24 DIAGNOSIS — R079 Chest pain, unspecified: Secondary | ICD-10-CM

## 2020-05-24 HISTORY — DX: Other pulmonary embolism without acute cor pulmonale: I26.99

## 2020-05-24 HISTORY — DX: Pure hypercholesterolemia, unspecified: E78.00

## 2020-05-24 MED ORDER — LIDOCAINE 5 % EX PTCH
1.0000 | MEDICATED_PATCH | CUTANEOUS | Status: DC
  Administered 2020-05-24: 1 via TRANSDERMAL
  Filled 2020-05-24: qty 1

## 2020-05-24 MED ORDER — LIDOCAINE 5 % EX PTCH
1.0000 | MEDICATED_PATCH | Freq: Two times a day (BID) | CUTANEOUS | 0 refills | Status: AC
Start: 2020-05-24 — End: 2021-05-24

## 2020-05-24 MED ORDER — ACETAMINOPHEN 500 MG PO TABS
1000.0000 mg | ORAL_TABLET | Freq: Once | ORAL | Status: AC
  Administered 2020-05-24: 1000 mg via ORAL
  Filled 2020-05-24: qty 2

## 2020-05-24 NOTE — ED Provider Notes (Signed)
[facility] Emergency Department Provider Note   ____________________________________________   First MD Initiated Contact with Patient 05/24/20 0158     (approximate)  I have reviewed the triage vital signs and the nursing notes.   HISTORY  Chief Complaint Chest Pain    HPI Benjamin Delacruz is a 54 y.o. male with past medical history of hypertension, hyperlipidemia, and DVT/PE on Xarelto who presents to the ED complaining of chest pain.  Patient reports that he has noticed increased swelling and pain to his right lower leg over the past couple of days.  This is been associated with soreness in his mid upper back that is worse when he goes to take a deep breath.  He additionally has some discomfort in the front of his chest that is also worse with a deep breath. He describes these pains as sharp and denies any fevers or cough.  He states he has been compliant with his Xarelto and has not missed any doses, but is concerned because his current symptoms are similar to when he previously was diagnosed with PE.        Past Medical History:  Diagnosis Date  . DVT (deep venous thrombosis) ([place])   . Hypercholesteremia   . MI (myocardial infarction) ([place])   . Pulmonary embolism ([facility])     There are no problems to display for this patient.   Past Surgical History:  Procedure Laterality Date  . IVC FILTER INSERTION      Prior to Admission medications   Medication Sig Start Date End Date Taking? Authorizing Provider  carvedilol (COREG) 6.25 MG tablet Take 6.25 mg by mouth 2 (two) times daily. 08/21/19   [provider]  furosemide (LASIX) 20 MG tablet Take 20 mg by mouth daily.    [provider]  ipratropium (ATROVENT HFA) 17 MCG/ACT inhaler Inhale 2 puffs into the lungs every 6 (six) hours as needed for wheezing.     [provider]  rivaroxaban (XARELTO) 20 MG TABS tablet Take 20 mg by mouth daily. 07/16/17   [provider]      Allergies Patient has no known allergies.  History reviewed. No pertinent family history.  Social History Social History   Tobacco Use  . Smoking status: Never Smoker  . Smokeless tobacco: Never Used  Vaping Use  . Vaping Use: Never used  Substance Use Topics  . Alcohol use: Not Currently  . Drug use: Not Currently    Review of Systems  Constitutional: No fever/chills Eyes: No visual changes. ENT: No sore throat. Cardiovascular: Positive for chest pain. Respiratory: Denies shortness of breath. Gastrointestinal: No abdominal pain.  No nausea, no vomiting.  No diarrhea.  No constipation. Genitourinary: Negative for dysuria. Musculoskeletal: Positive for back pain.  Positive for leg swelling and pain. Skin: Negative for rash. Neurological: Negative for headaches, focal weakness or numbness.  ____________________________________________   PHYSICAL EXAM:  VITAL SIGNS: ED Triage Vitals  Enc Vitals Group     BP 05/23/20 1647 138/84     Pulse Rate 05/23/20 1647 87     Resp 05/23/20 1647 (!) 22     Temp 05/23/20 1647 98.2 F (36.8 C)     Temp Source 05/23/20 1647 Oral     SpO2 05/23/20 1647 100 %     Weight 05/23/20 1638 240 lb (108.9 kg)     Height 05/23/20 1638 5\' 7"  (1.702 m)     Head Circumference --      Peak Flow --  Pain Score 05/23/20 1637 9     Pain Loc --      Pain Edu? --      Excl. in [place]? --     Constitutional: Alert and oriented. Eyes: Conjunctivae are normal. Head: Atraumatic. Nose: No congestion/rhinnorhea. Mouth/Throat: Mucous membranes are moist. Neck: Normal ROM Cardiovascular: Normal rate, regular rhythm. Grossly normal heart sounds. Respiratory: Normal respiratory effort.  No retractions. Lungs CTAB. Gastrointestinal: Soft and nontender. No distention. Genitourinary: deferred Musculoskeletal: 1+ pitting edema to bilateral lower extremities with right calf tenderness.  2+ DP pulses bilaterally. Neurologic:  Normal speech and  language. No gross focal neurologic deficits are appreciated. Skin:  Skin is warm, dry and intact. No rash noted. Psychiatric: Mood and affect are normal. Speech and behavior are normal.  ____________________________________________   LABS (all labs ordered are listed, but only abnormal results are displayed)  Labs Reviewed  BASIC METABOLIC PANEL - Abnormal; Notable for the following components:      Result Value   Creatinine, Ser 1.25 (*)    All other components within normal limits  CBC  TROPONIN I (HIGH SENSITIVITY)  TROPONIN I (HIGH SENSITIVITY)   ____________________________________________  EKG  ED ECG REPORT I, Blake Divine, the attending physician, personally viewed and interpreted this ECG.   Date: 05/24/2020  EKG Time: 16:39  Rate: 87  Rhythm: normal sinus rhythm  Axis: LAD  Intervals:none  ST&T Change: None   PROCEDURES  Procedure(s) performed (including Critical Care):  Procedures   ____________________________________________   INITIAL IMPRESSION / ASSESSMENT AND PLAN / ED COURSE       54 year old male with past medical history of hypertension, hyperlipidemia, and DVT/PE on Xarelto who presents to the ED complaining of worsening pain and swelling in his right lower extremity as well as pleuritic pain in the middle of his upper back and center of his chest over the past couple of days.  He initially reported some shortness of breath, but denies this on my assessment and is not in any respiratory distress, maintaining O2 sats on room air.  CTA was performed from triage and negative for PE or other acute process.  He also had ultrasound of his right lower extremity that is negative for DVT.  Lab work is reassuring, 2 sets of troponin negative and EKG shows no evidence of arrhythmia or ischemia.  Given unremarkable work-up, I suspect patient's chest and back soreness is musculoskeletal in etiology and we will treat with Tylenol and Lidoderm patch.  He was  advised to follow-up with his PCP for repeat ultrasound if pain persists, counseled to return to the ED for new worsening symptoms.  Patient agrees with plan.      ____________________________________________   FINAL CLINICAL IMPRESSION(S) / ED DIAGNOSES  Final diagnoses:  Nonspecific chest pain  Leg swelling     ED Discharge Orders    None       Note:  This document was prepared using Dragon voice recognition software and may include unintentional dictation errors.   Blake Divine, MD 05/24/20 [phone]

## 2020-05-24 NOTE — ED Notes (Signed)
Pt states coming in due to chest pain. Pt complaining of back pain at this time. Pt is alert and oriented. Pt states history of MI and blood clots to lungs and legs.

## 2020-06-17 ENCOUNTER — Emergency Department
Admission: EM | Admit: 2020-06-17 | Discharge: 2020-06-17 | Disposition: A | Discharge status: Home / Self Care | Payer: Medicare Other | Attending: Student in an Organized Health Care Education/Training Program | Admitting: Student in an Organized Health Care Education/Training Program

## 2020-06-17 ENCOUNTER — Encounter: Payer: Self-pay | Admitting: Emergency Medicine

## 2020-06-17 ENCOUNTER — Other Ambulatory Visit: Payer: Self-pay

## 2020-06-17 ENCOUNTER — Emergency Department: Payer: Medicare Other

## 2020-06-17 DIAGNOSIS — M25562 Pain in left knee: Secondary | ICD-10-CM | POA: Diagnosis present

## 2020-06-17 LAB — CBC WITH DIFFERENTIAL/PLATELET
Abs Immature Granulocytes: 0.07 10*3/uL (ref 0.00–0.07)
Basophils Absolute: 0 10*3/uL (ref 0.0–0.1)
Basophils Relative: 0 %
Eosinophils Absolute: 0 10*3/uL (ref 0.0–0.5)
Eosinophils Relative: 0 %
HCT: 40.2 % (ref 39.0–52.0)
Hemoglobin: 13.6 g/dL (ref 13.0–17.0)
Immature Granulocytes: 1 %
Lymphocytes Relative: 15 %
Lymphs Abs: 2.2 10*3/uL (ref 0.7–4.0)
MCH: 29.2 pg (ref 26.0–34.0)
MCHC: 33.8 g/dL (ref 30.0–36.0)
MCV: 86.5 fL (ref 80.0–100.0)
Monocytes Absolute: 1 10*3/uL (ref 0.1–1.0)
Monocytes Relative: 6 %
Neutro Abs: 11.9 10*3/uL — ABNORMAL HIGH (ref 1.7–7.7)
Neutrophils Relative %: 78 %
Platelets: 201 10*3/uL (ref 150–400)
RBC: 4.65 MIL/uL (ref 4.22–5.81)
RDW: 12.8 % (ref 11.5–15.5)
WBC: 15.1 10*3/uL — ABNORMAL HIGH (ref 4.0–10.5)
nRBC: 0.2 % (ref 0.0–0.2)

## 2020-06-17 LAB — COMPREHENSIVE METABOLIC PANEL
ALT: 12 U/L (ref 0–44)
AST: 19 U/L (ref 15–41)
Albumin: 3.9 g/dL (ref 3.5–5.0)
Alkaline Phosphatase: 86 U/L (ref 38–126)
Anion gap: 9 (ref 5–15)
BUN: 12 mg/dL (ref 6–20)
CO2: 22 mmol/L (ref 22–32)
Calcium: 8.9 mg/dL (ref 8.9–10.3)
Chloride: 109 mmol/L (ref 98–111)
Creatinine, Ser: 1.2 mg/dL (ref 0.61–1.24)
GFR calc Af Amer: >60 mL/min (ref >60)
GFR calc non Af Amer: >60 mL/min (ref >60)
Glucose, Bld: 97 mg/dL (ref 70–99)
Potassium: 3.6 mmol/L (ref 3.5–5.1)
Sodium: 140 mmol/L (ref 135–145)
Total Bilirubin: 0.9 mg/dL (ref 0.3–1.2)
Total Protein: 7 g/dL (ref 6.5–8.1)

## 2020-06-17 LAB — URIC ACID: Uric Acid, Serum: 8.6 mg/dL (ref 3.7–8.6)

## 2020-06-17 MED ORDER — OXYCODONE-ACETAMINOPHEN 5-325 MG PO TABS: 1.0000 | ORAL_TABLET | Freq: Four times a day (QID) | ORAL | 0 refills | Status: AC | PRN

## 2020-06-17 MED ORDER — KETOROLAC TROMETHAMINE 30 MG/ML IJ SOLN
30.0000 mg | Freq: Once | INTRAMUSCULAR | Status: AC
  Administered 2020-06-17: 30 mg via INTRAMUSCULAR
  Filled 2020-06-17: qty 1

## 2020-06-17 MED ORDER — OXYCODONE-ACETAMINOPHEN 5-325 MG PO TABS
1.0000 | ORAL_TABLET | Freq: Once | ORAL | Status: AC
  Administered 2020-06-17: 1 via ORAL
  Filled 2020-06-17: qty 1

## 2020-06-17 NOTE — ED Triage Notes (Signed)
C/O twisting left knee on Tuesday while getting away from yellow jackets.

## 2020-06-17 NOTE — ED Provider Notes (Signed)
[facility] Emergency Department Provider Note  ____________________________________________  Time seen: Approximately 12:16 PM  I have reviewed the triage vital signs and the nursing notes.   HISTORY  Chief Complaint Knee Pain    HPI Benjamin Delacruz is a 54 y.o. male that presents to the emergency department for evaluation of left knee pain since yesterday after getting stung by multiple bees 2 days ago.  He twisted his left knee while he was trying to swat away the bees.  He does not think any bees stung his legs and thinks most of the stings went to his arms and his back.  Patient was evaluated at urgent care and given a shot of Solu-Medrol and placed on prednisone.  Yesterday he had pain with moving his left knee and difficult bearing weight on his left knee.  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  No history of gout.  No wounds.   Past Medical History:  Diagnosis Date   DVT (deep venous thrombosis) (HCC)    Hypercholesteremia    MI (myocardial infarction) ([place])    Pulmonary embolism (HCC)     There are no problems to display for this patient.   Past Surgical History:  Procedure Laterality Date   IVC FILTER INSERTION      Prior to Admission medications   Medication Sig Start Date End Date Taking? Authorizing Provider  carvedilol (COREG) 6.25 MG tablet Take 6.25 mg by mouth 2 (two) times daily. 08/21/19   [provider]  furosemide (LASIX) 20 MG tablet Take 20 mg by mouth daily.    [provider]  ipratropium (ATROVENT HFA) 17 MCG/ACT inhaler Inhale 2 puffs into the lungs every 6 (six) hours as needed for wheezing.     [provider]  lidocaine (LIDODERM) 5 % Place 1 patch onto the skin every 12 (twelve) hours. Remove & Discard patch within 12 hours or as directed by MD 05/24/20 05/24/21  Blake Divine, MD  oxyCODONE-acetaminophen (PERCOCET) 5-325 MG tablet Take 1 tablet by mouth every 6 (six) hours as needed for  up to 2 days for severe pain. 06/17/20 06/19/20  Laban Emperor, PA-C  rivaroxaban (XARELTO) 20 MG TABS tablet Take 20 mg by mouth daily. 07/16/17   [provider]    Allergies Patient has no known allergies.  No family history on file.  Social History Social History   Tobacco Use   Smoking status: Never Smoker   Smokeless tobacco: Never Used  Scientific laboratory technician Use: Never used  Substance Use Topics   Alcohol use: Not Currently   Drug use: Not Currently     Review of Systems  Constitutional: No fever Cardiovascular: No chest pain. Respiratory: No cough. No SOB. Gastrointestinal: No nausea, no vomiting.  Musculoskeletal: Positive for knee pain. Skin: Negative for rash, abrasions, lacerations, ecchymosis. Neurological: Negative for numbness or tingling   ____________________________________________   PHYSICAL EXAM:  VITAL SIGNS: ED Triage Vitals  Enc Vitals Group     BP 06/17/20 0721 (!) 140/91     Pulse Rate 06/17/20 0721 82     Resp 06/17/20 0721 16     Temp 06/17/20 0721 97.9 F (36.6 C)     Temp Source 06/17/20 0721 Oral     SpO2 06/17/20 0721 98 %     Weight 06/17/20 0718 240 lb 1.3 oz (108.9 kg)     Height 06/17/20 0718 5\' 7"  (1.702 m)     Head Circumference --  Peak Flow --      Pain Score 06/17/20 0718 10     Pain Loc --      Pain Edu? --      Excl. in [place]? --      Constitutional: Alert and oriented. Well appearing and in no acute distress. Eyes: Conjunctivae are normal. PERRL. EOMI. Head: Atraumatic. ENT:      Ears:      Nose: No congestion/rhinnorhea.      Mouth/Throat: Mucous membranes are moist.  Neck: No stridor.   Cardiovascular: Normal rate, regular rhythm.  Good peripheral circulation. Respiratory: Normal respiratory effort without tachypnea or retractions. Lungs CTAB. Good air entry to the bases with no decreased or absent breath sounds. Musculoskeletal:No gross deformities appreciated.  Pain with any range of motion  of left knee.  Diffuse tenderness to palpation to left knee.  Pain to knee elicited with range of motion of left hip and left ankle.  No knee swelling.  Knee is not red nor hot.  No erythema. Neurologic:  Normal speech and language. No gross focal neurologic deficits are appreciated.  Skin:  Skin is warm, dry and intact. No rash noted. Psychiatric: Mood and affect are normal. Speech and behavior are normal. Patient exhibits appropriate insight and judgement.   ____________________________________________   LABS (all labs ordered are listed, but only abnormal results are displayed)  Labs Reviewed  CBC WITH DIFFERENTIAL/PLATELET - Abnormal; Notable for the following components:      Result Value   WBC 15.1 (*)    Neutro Abs 11.9 (*)    All other components within normal limits  COMPREHENSIVE METABOLIC PANEL  URIC ACID   ____________________________________________  EKG   ____________________________________________  RADIOLOGY Robinette Haines, personally viewed and evaluated these images (plain radiographs) as part of my medical decision making, as well as reviewing the written report by the radiologist.  Knee Xray IMPRESSION:  No left knee fracture, joint effusion or malalignment. Moderate  superior left patellar enthesophyte.   US Venous Img Lower Unilateral Left IMPRESSION:  No evidence of left lower extremity deep venous thrombosis.    ____________________________________________    PROCEDURES  Procedure(s) performed:    Procedures    Medications  oxyCODONE-acetaminophen (PERCOCET/ROXICET) 5-325 MG per tablet 1 tablet (1 tablet Oral Given 06/17/20 0953)  oxyCODONE-acetaminophen (PERCOCET/ROXICET) 5-325 MG per tablet 1 tablet (1 tablet Oral Given 06/17/20 1249)  ketorolac (TORADOL) 30 MG/ML injection 30 mg (30 mg Intramuscular Given 06/17/20 1249)     ____________________________________________   INITIAL IMPRESSION / ASSESSMENT AND PLAN / ED  COURSE  Pertinent labs & imaging results that were available during my care of the patient were reviewed by me and considered in my medical decision making (see chart for details).  Review of the [place] CSRS was performed in accordance of the [place] prior to dispensing any controlled drugs.   Patient presented to the emergency department for evaluation of left knee pain after injury 2 days ago.  Vital signs and exam are reassuring.  X-ray negative for acute bony abnormality.  Ultrasound negative for DVT.  White blood cell count is elevated at 15.1, likely due to patient's current prednisone use.  Patient has very limited range of motion of left knee due to pain.  Low suspicion for septic joint, as knee is not swollen, not hot to the touch, and there is no erythema.  Patient is afebrile.  Uric acid within normal limits.  Patient was given Percocet and Toradol for pain.  Case was  discussed with Dr. Quentin Cornwall, who is in agreement with plan of care for outpatient follow-up with orthopedics for probable ligament injury.  Knee immobilizer was placed.  Crutches were given.  Patient will be discharged home with prescriptions for a short course of Percocet. Patient is to follow up with orthopedics as directed.  Referral to Dr. Posey Pronto was given.  Patient is given ED precautions to return to the ED for any worsening or new symptoms.  Benjamin Delacruz was evaluated in Emergency Department on 06/18/2020 for the symptoms described in the history of present illness. He was evaluated in the context of the global COVID-19 pandemic, which necessitated consideration that the patient might be at risk for infection with the SARS-CoV-2 virus that causes COVID-19. Institutional protocols and algorithms that pertain to the evaluation of patients at risk for COVID-19 are in a state of rapid change based on information released by regulatory bodies including the CDC and federal and state organizations. These policies and algorithms were  followed during the patient's care in the ED.   ____________________________________________  FINAL CLINICAL IMPRESSION(S) / ED DIAGNOSES  Final diagnoses:  Acute pain of left knee      NEW MEDICATIONS STARTED DURING THIS VISIT:  ED Discharge Orders         Ordered    oxyCODONE-acetaminophen (PERCOCET) 5-325 MG tablet  Every 6 hours PRN     Discontinue  Reprint     06/17/20 1218              This chart was dictated using voice recognition software/Dragon. Despite best efforts to proofread, errors can occur which can change the meaning. Any change was purely unintentional.    Laban Emperor, PA-C 06/18/20 1627    Merlyn Lot, MD 06/18/20 [phone]

## 2020-06-17 NOTE — ED Notes (Signed)
See triage note  States he was trying to getting away from some bees couple of days ago  Unsure if he twisted his left leg  Having pain mainly to left anterior knee  Unable to bear full wt and area is tender to touch

## 2020-06-17 NOTE — Discharge Instructions (Signed)
Please use knee immobilizer and crutches.  You can take Percocet for extreme pain.  Ice and elevate knee tonight.  Please call orthopedics this afternoon for a follow-up appointment tomorrow or early next week.

## 2021-04-10 IMAGING — CT CT ANGIOGRAPHY CHEST
2 of 6 series · 18 of 46 positions shown · IV contrast (omnipaque)
Comparison: None.

CLINICAL DATA: Chest pain, pleuritic pain

EXAM:
CT ANGIOGRAPHY CHEST WITH CONTRAST
TECHNIQUE: Multidetector CT imaging of the chest was performed using the
standard protocol during bolus administration of intravenous
contrast. Multiplanar CT image reconstructions and MIPs were
obtained to evaluate the vascular anatomy.
CONTRAST:  75mL OMNIPAQUE IOHEXOL 350 MG/ML SOLN

[Series 5: thins · axial · 0.67mm/px · z∈[-281,-36]mm · 15 of 269 slices shown]
[im 12/269  lung]
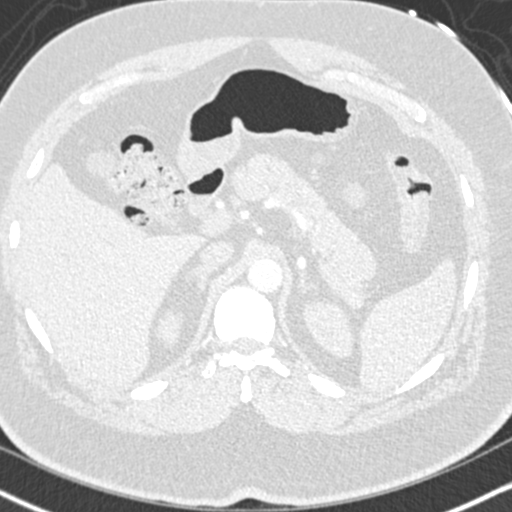
[im 35/269  soft-tissue]
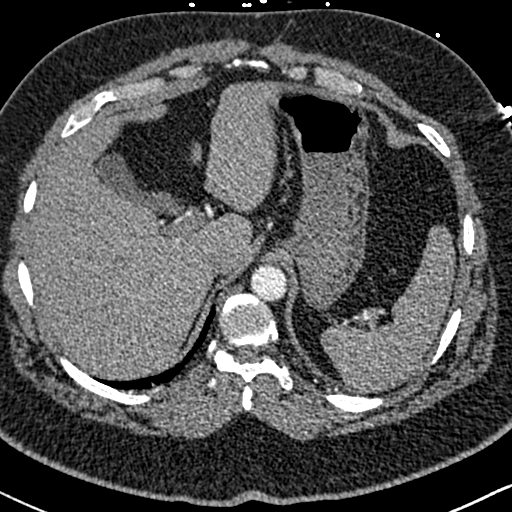
[im 47/269  lung]
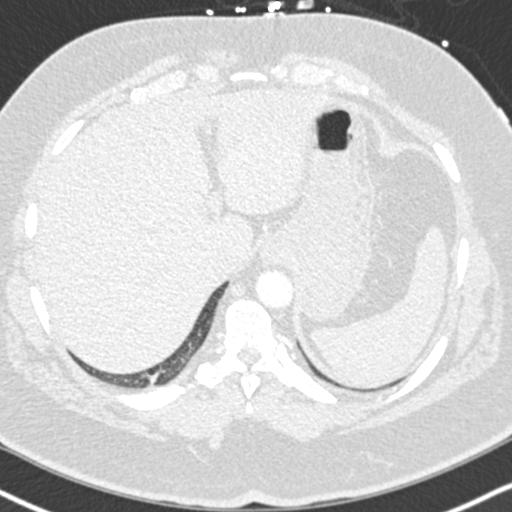
[im 70/269  soft-tissue]
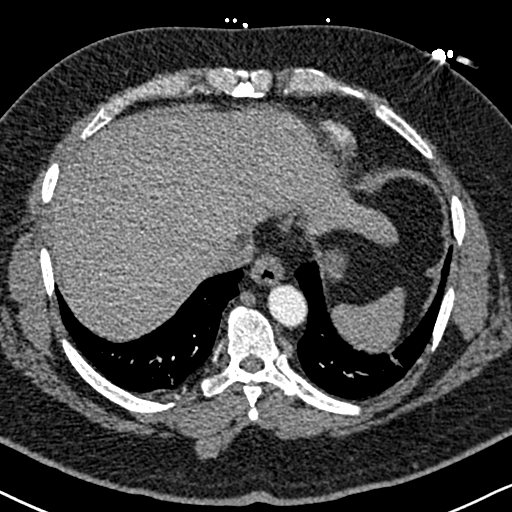
[im 82/269  lung]
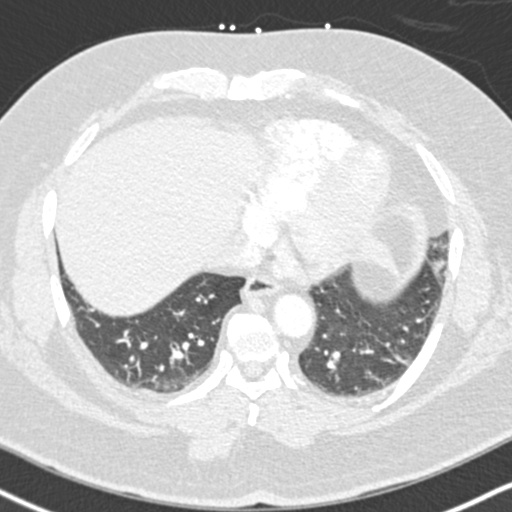
[im 105/269  soft-tissue]
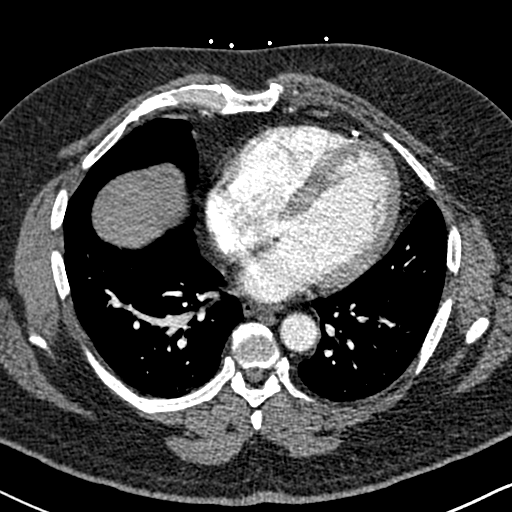
[im 117/269  lung]
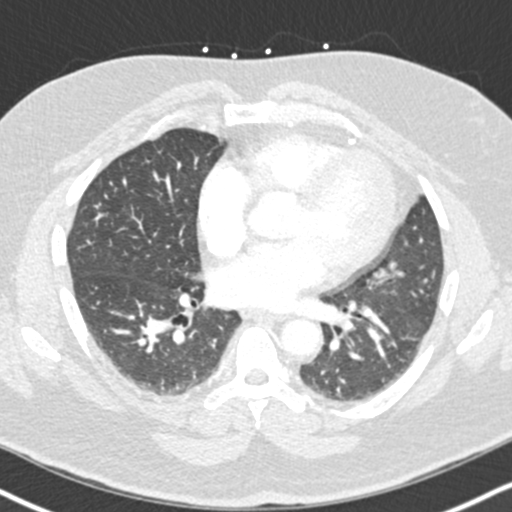
[im 140/269  soft-tissue]
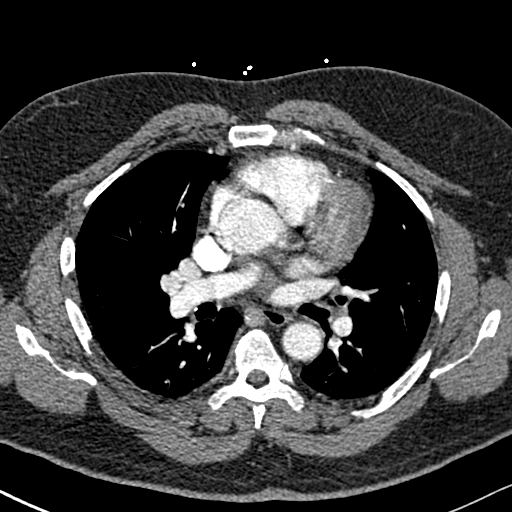
[im 152/269  lung]
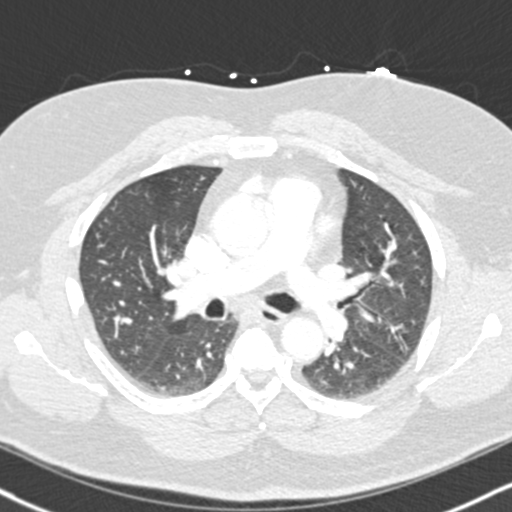
[im 164/269  soft-tissue]
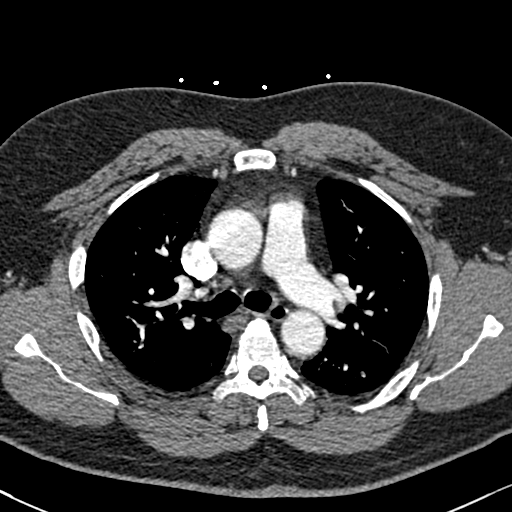
[im 187/269  lung]
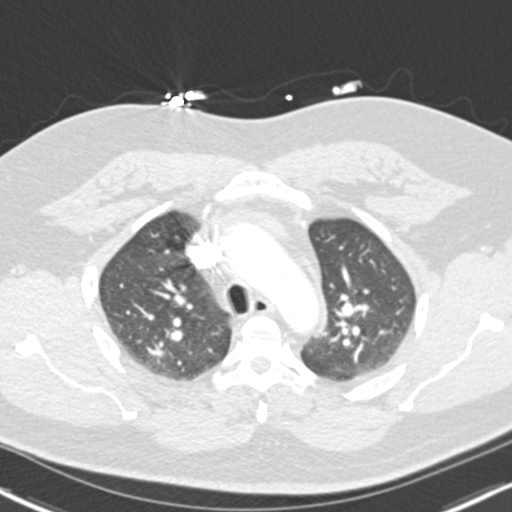
[im 199/269  soft-tissue]
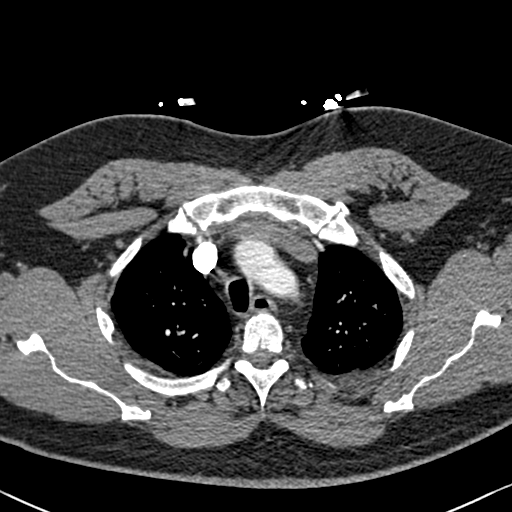
[im 222/269  lung]
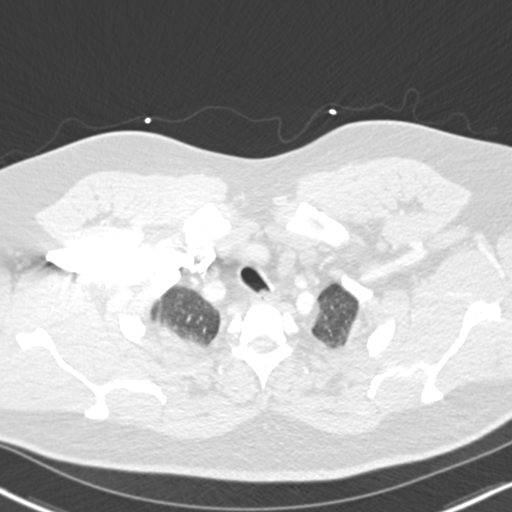
[im 234/269  soft-tissue]
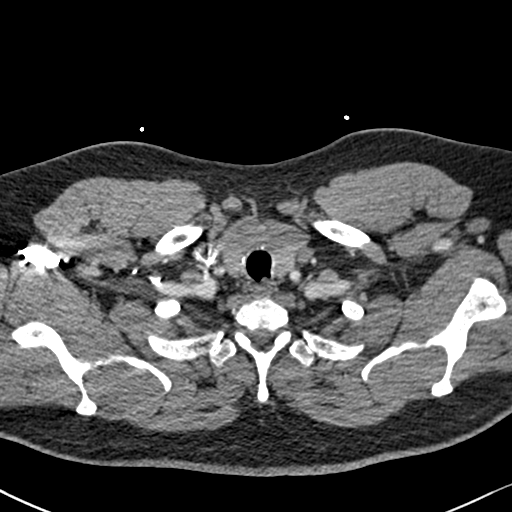
[im 257/269  lung]
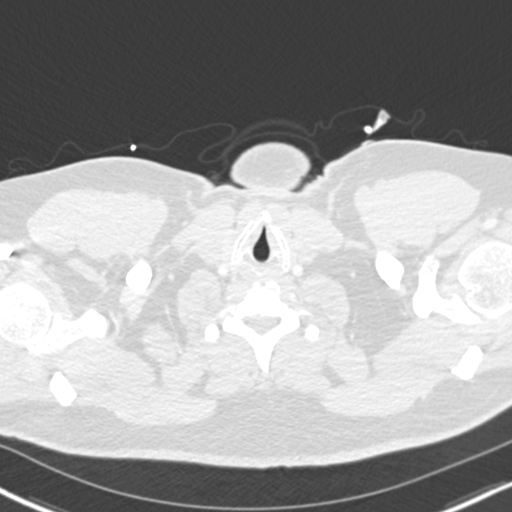

[Series 7: coronal mpr · coronal · 0.53mm/px · 3 of 97 slices shown]
[im 25/97  soft-tissue]
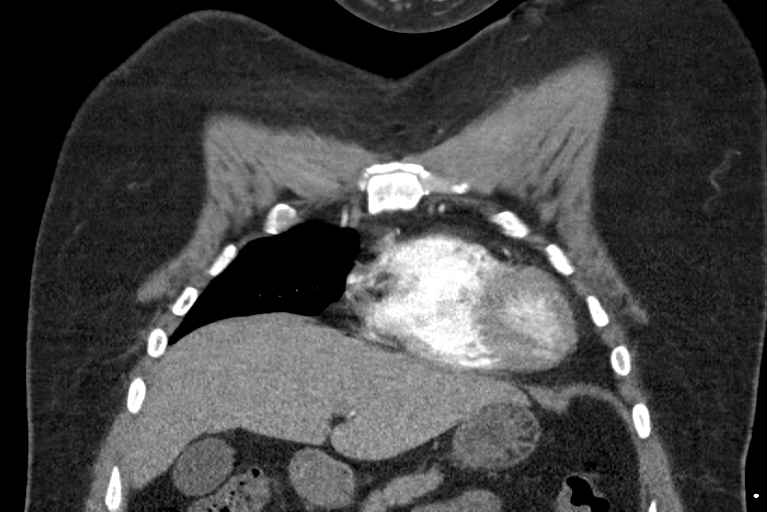
[im 49/97  soft-tissue]
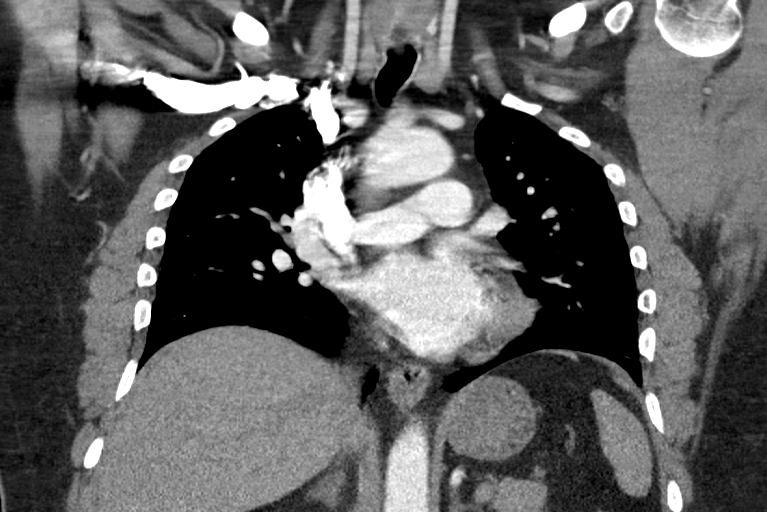
[im 73/97  soft-tissue]
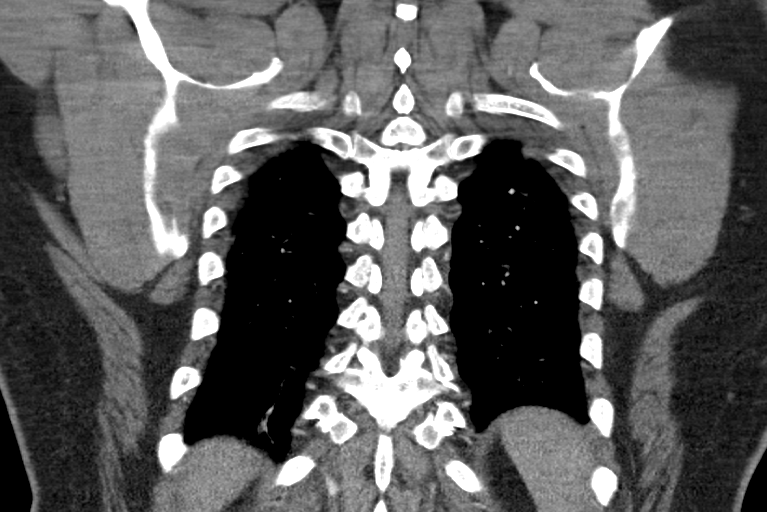

[18 of 46 positions shown; findings below may reference images not displayed]

FINDINGS: Cardiovascular: Satisfactory opacification of the pulmonary arteries
to the segmental level. No evidence of pulmonary embolism. Normal
heart size. No pericardial effusion.

Mediastinum/Nodes: No enlarged mediastinal, hilar, or axillary lymph
nodes. Thyroid gland, trachea, and esophagus demonstrate no
significant findings.

Lungs/Pleura: Lungs are clear. No pleural effusion or pneumothorax.

Upper Abdomen: No acute abnormality.

Musculoskeletal: No chest wall abnormality. No acute or significant
osseous findings.

Review of the MIP images confirms the above findings.
IMPRESSION: 1. No evidence of pulmonary embolus.
2. No acute cardiopulmonary disease.

## 2021-07-06 ENCOUNTER — Other Ambulatory Visit: Payer: Self-pay

## 2021-07-06 ENCOUNTER — Telehealth: Payer: Self-pay

## 2021-07-06 DIAGNOSIS — Z86010 Personal history of colonic polyps: Secondary | ICD-10-CM

## 2021-07-06 MED ORDER — NA SULFATE-K SULFATE-MG SULF 17.5-3.13-1.6 GM/177ML PO SOLN: 354.0000 mL | Freq: Once | ORAL | 0 refills | Status: AC

## 2021-07-06 NOTE — Telephone Encounter (Signed)
Gastroenterology Pre-Procedure Review  Request Date: 08/09/2021 Requesting Physician: Dr. Vicente Males   PATIENT REVIEW QUESTIONS: The patient responded to the following health history questions as indicated:    1. Are you having any GI issues? no 2. Do you have a personal history of Polyps? Yes last colonoscopy  3. Do you have a family history of Colon Cancer or Polyps? Sister had colon cancer  4. Diabetes Mellitus? no 5. Joint replacements in the past 12 months?no 6. Major health problems in the past 3 months?no 7. Any artificial heart valves, MVP, or defibrillator?no    MEDICATIONS & ALLERGIES:    Patient reports the following regarding taking any anticoagulation/antiplatelet therapy:   Plavix, Coumadin, Eliquis, Xarelto, Lovenox, Pradaxa, Brilinta, or Effient? Yes Xareleto Dr. Theda Sers at PCP  Aspirin? no  Patient confirms/reports the following medications:  Current Outpatient Medications  Medication Sig Dispense Refill   carvedilol (COREG) 6.25 MG tablet Take 6.25 mg by mouth 2 (two) times daily.     furosemide (LASIX) 20 MG tablet Take 20 mg by mouth daily.     ipratropium (ATROVENT HFA) 17 MCG/ACT inhaler Inhale 2 puffs into the lungs every 6 (six) hours as needed for wheezing.      rivaroxaban (XARELTO) 20 MG TABS tablet Take 20 mg by mouth daily.     No current facility-administered medications for this visit.    Patient confirms/reports the following allergies:  No Known Allergies  No orders of the defined types were placed in this encounter.   AUTHORIZATION INFORMATION Primary Insurance: 1D#: Group #:  Secondary Insurance: 1D#: Group #:  SCHEDULE INFORMATION: Date:  Time: Location:

## 2021-07-07 ENCOUNTER — Telehealth: Payer: Self-pay

## 2021-07-07 NOTE — Telephone Encounter (Signed)
We received blood thinner request back from patient PCP. Patient is to stop Xarelto 1 day prior to procedure and restart it 1 day after procedure

## 2021-07-12 NOTE — Telephone Encounter (Signed)
Patient verbalized understanding of instructions  

## 2021-08-09 ENCOUNTER — Ambulatory Visit: Payer: Managed Care, Other (non HMO)

## 2021-08-09 ENCOUNTER — Encounter
Admission: RE | Disposition: A | Discharge status: Home / Self Care | Payer: Self-pay | Source: Home / Self Care | Attending: Gastroenterology

## 2021-08-09 ENCOUNTER — Ambulatory Visit
Admission: RE | Admit: 2021-08-09 | Discharge: 2021-08-09 | Disposition: A | Discharge status: Home / Self Care | Payer: Managed Care, Other (non HMO) | Attending: Gastroenterology | Admitting: Gastroenterology

## 2021-08-09 ENCOUNTER — Other Ambulatory Visit: Payer: Self-pay

## 2021-08-09 ENCOUNTER — Encounter: Payer: Self-pay | Admitting: Gastroenterology

## 2021-08-09 DIAGNOSIS — Z7901 Long term (current) use of anticoagulants: Secondary | ICD-10-CM | POA: Insufficient documentation

## 2021-08-09 DIAGNOSIS — Z955 Presence of coronary angioplasty implant and graft: Secondary | ICD-10-CM | POA: Insufficient documentation

## 2021-08-09 DIAGNOSIS — Z86718 Personal history of other venous thrombosis and embolism: Secondary | ICD-10-CM | POA: Insufficient documentation

## 2021-08-09 DIAGNOSIS — Z86010 Personal history of colonic polyps: Secondary | ICD-10-CM

## 2021-08-09 DIAGNOSIS — K635 Polyp of colon: Secondary | ICD-10-CM | POA: Diagnosis not present

## 2021-08-09 DIAGNOSIS — Z1211 Encounter for screening for malignant neoplasm of colon: Secondary | ICD-10-CM | POA: Diagnosis not present

## 2021-08-09 DIAGNOSIS — D122 Benign neoplasm of ascending colon: Secondary | ICD-10-CM | POA: Diagnosis not present

## 2021-08-09 DIAGNOSIS — Z79899 Other long term (current) drug therapy: Secondary | ICD-10-CM | POA: Insufficient documentation

## 2021-08-09 DIAGNOSIS — Z86711 Personal history of pulmonary embolism: Secondary | ICD-10-CM | POA: Insufficient documentation

## 2021-08-09 HISTORY — PX: COLONOSCOPY WITH PROPOFOL: SHX5780

## 2021-08-09 HISTORY — DX: Sleep apnea, unspecified: G47.30

## 2021-08-09 SURGERY — COLONOSCOPY WITH PROPOFOL
Anesthesia: General

## 2021-08-09 MED ORDER — PROPOFOL 500 MG/50ML IV EMUL
INTRAVENOUS | Status: AC
  Filled 2021-08-09: qty 50

## 2021-08-09 MED ORDER — PROPOFOL 10 MG/ML IV BOLUS
INTRAVENOUS | Status: DC | PRN
  Administered 2021-08-09: 70 mg via INTRAVENOUS

## 2021-08-09 MED ORDER — LIDOCAINE HCL (PF) 2 % IJ SOLN
INTRAMUSCULAR | Status: AC
  Filled 2021-08-09: qty 10

## 2021-08-09 MED ORDER — SODIUM CHLORIDE 0.9 % IV SOLN: INTRAVENOUS | Status: DC

## 2021-08-09 MED ORDER — PROPOFOL 500 MG/50ML IV EMUL
INTRAVENOUS | Status: DC | PRN
  Administered 2021-08-09: 125 ug/kg/min via INTRAVENOUS

## 2021-08-09 MED ORDER — LIDOCAINE HCL (CARDIAC) PF 100 MG/5ML IV SOSY
PREFILLED_SYRINGE | INTRAVENOUS | Status: DC | PRN
  Administered 2021-08-09: 60 mg via INTRAVENOUS

## 2021-08-09 NOTE — Anesthesia Postprocedure Evaluation (Signed)
Anesthesia Post Note  Patient: Benjamin Delacruz  Procedure(s) Performed: COLONOSCOPY WITH PROPOFOL  Patient location during evaluation: Endoscopy Anesthesia Type: General Level of consciousness: awake and alert and oriented Pain management: pain level controlled Vital Signs Assessment: post-procedure vital signs reviewed and stable Respiratory status: spontaneous breathing, nonlabored ventilation and respiratory function stable Cardiovascular status: blood pressure returned to baseline and stable Postop Assessment: no signs of nausea or vomiting Anesthetic complications: no   No notable events documented.   Last Vitals:  Vitals:   08/09/21 0930 08/09/21 0940  BP: 113/67 124/84  Pulse: 69 68  Resp: (!) 26 17  Temp:    SpO2: 100% 100%    Last Pain:  Vitals:   08/09/21 0910  TempSrc: Temporal  PainSc:                  Natanel Snavely

## 2021-08-09 NOTE — Anesthesia Preprocedure Evaluation (Signed)
Anesthesia Evaluation  Patient identified by MRN, date of birth, ID band Patient awake    Reviewed: Allergy & Precautions, NPO status , Patient's Chart, lab work & pertinent test results  History of Anesthesia Complications Negative for: history of anesthetic complications  Airway Mallampati: III  TM Distance: >3 FB Neck ROM: Full    Dental  (+) Poor Dentition, Missing   Pulmonary sleep apnea and Continuous Positive Airway Pressure Ventilation , neg COPD,    breath sounds clear to auscultation- rhonchi (-) wheezing      Cardiovascular Exercise Tolerance: Good (-) hypertension(-) angina+ CAD, + Past MI and + Cardiac Stents (15 yrs ago)  (-) CABG  Rhythm:Regular Rate:Normal - Systolic murmurs and - Diastolic murmurs    Neuro/Psych neg Seizures negative neurological ROS  negative psych ROS   GI/Hepatic negative GI ROS, Neg liver ROS,   Endo/Other  negative endocrine ROSneg diabetes  Renal/GU negative Renal ROS     Musculoskeletal negative musculoskeletal ROS (+)   Abdominal (+) + obese,   Peds  Hematology negative hematology ROS (+)   Anesthesia Other Findings Past Medical History: No date: DVT (deep venous thrombosis) (HCC) No date: Hypercholesteremia No date: MI (myocardial infarction) ([place]) No date: Pulmonary embolism (HCC) No date: Sleep apnea   Reproductive/Obstetrics                             Anesthesia Physical Anesthesia Plan  ASA: 3  Anesthesia Plan: General   Post-op Pain Management:    Induction: Intravenous  PONV Risk Score and Plan: 1 and Propofol infusion  Airway Management Planned: Natural Airway  Additional Equipment:   Intra-op Plan:   Post-operative Plan:   Informed Consent: I have reviewed the patients History and Physical, chart, labs and discussed the procedure including the risks, benefits and alternatives for the proposed anesthesia with the  patient or authorized representative who has indicated his/her understanding and acceptance.     Dental advisory given  Plan Discussed with: CRNA and Anesthesiologist  Anesthesia Plan Comments:         Anesthesia Quick Evaluation

## 2021-08-09 NOTE — Transfer of Care (Signed)
Immediate Anesthesia Transfer of Care Note  Patient: Benjamin Delacruz  Procedure(s) Performed: COLONOSCOPY WITH PROPOFOL  Patient Location: PACU  Anesthesia Type:General  Level of Consciousness: awake, alert  and oriented  Airway & Oxygen Therapy: Patient Spontanous Breathing  Post-op Assessment: Report given to RN and Post -op Vital signs reviewed and stable  Post vital signs: Reviewed and stable  Last Vitals:  Vitals Value Taken Time  BP 111/70 08/09/21 0915  Temp    Pulse 79 08/09/21 0915  Resp    SpO2 98 % 08/09/21 0915  Vitals shown include unvalidated device data.  Last Pain:  Vitals:   08/09/21 0817  PainSc: 0-No pain         Complications: No notable events documented.

## 2021-08-09 NOTE — Op Note (Signed)
[facility] Gastroenterology Patient Name: Benjamin Delacruz Procedure Date: 08/09/2021 8:52 AM MRN: [id number] Account #: [id number] Date of Birth: Feb 08, 1966 Admit Type: Outpatient Age: 55 Room: [facility] ENDO ROOM 2 Gender: Male Note Status: Finalized Instrument Name: Jasper Riling [id number] Procedure:             Colonoscopy Indications:           Surveillance: Personal history of colonic polyps                         (unknown histology) on last colonoscopy more than 5                         years ago Providers:             Jonathon Bellows MD, MD Referring MD:          No Local Md, MD (Referring MD) Medicines:             Monitored Anesthesia Care Complications:         No immediate complications. Procedure:             Pre-Anesthesia Assessment:                        - Prior to the procedure, a History and Physical was                         performed, and patient medications, allergies and                         sensitivities were reviewed. The patient's tolerance                         of previous anesthesia was reviewed.                        - The risks and benefits of the procedure and the                         sedation options and risks were discussed with the                         patient. All questions were answered and informed                         consent was obtained.                        - ASA Grade Assessment: II - A patient with mild                         systemic disease.                        After obtaining informed consent, the colonoscope was                         passed under direct vision. Throughout the procedure,                         the patient's blood  pressure, pulse, and oxygen                         saturations were monitored continuously. The                         Colonoscope was introduced through the anus and                         advanced to the the cecum, identified by the                         appendiceal orifice.  The colonoscopy was performed                         with ease. The patient tolerated the procedure well.                         The quality of the bowel preparation was good. Findings:      The perianal and digital rectal examinations were normal.      A 5 mm polyp was found in the ascending colon. The polyp was sessile.       The polyp was removed with a cold snare. Resection and retrieval were       complete.      The exam was otherwise without abnormality on direct and retroflexion       views. Impression:            - One 5 mm polyp in the ascending colon, removed with                         a cold snare. Resected and retrieved.                        - The examination was otherwise normal on direct and                         retroflexion views. Recommendation:        - Discharge patient to home (with escort).                        - Resume previous diet.                        - Continue present medications.                        - Await pathology results.                        - Repeat colonoscopy for surveillance based on                         pathology results. Procedure Code(s):     --- Professional ---                        [phone], Colonoscopy, flexible; with removal of                         tumor(s), polyp(s), or other  lesion(s) by snare                         technique Diagnosis Code(s):     --- Professional ---                        Z86.010, Personal history of colonic polyps                        K63.5, Polyp of colon CPT copyright 2019 American Medical Association. All rights reserved. The codes documented in this report are preliminary and upon coder review may  be revised to meet current compliance requirements. Jonathon Bellows, MD Jonathon Bellows MD, MD 08/09/2021 9:10:50 AM This report has been signed electronically. Number of Addenda: 0 Note Initiated On: 08/09/2021 8:52 AM Scope Withdrawal Time: 0 hours 11 minutes 20 seconds  Total Procedure Duration: 0  hours 12 minutes 28 seconds  Estimated Blood Loss:  Estimated blood loss: none.      [facility]

## 2021-08-09 NOTE — H&P (Signed)
  Jonathon Bellows, MD [address] [place], [place], [place], [address] [id number], [place], [place], [place], [address] Phone: [phone]  Fax: [phone]  Primary Care Physician:  Pcp, No   Pre-Procedure History & Physical: HPI:  Benjamin Delacruz is a 55 y.o. male is here for an colonoscopy.   Past Medical History:  Diagnosis Date   DVT (deep venous thrombosis) (HCC)    Hypercholesteremia    MI (myocardial infarction) ([place])    Pulmonary embolism (HCC)    Sleep apnea     Past Surgical History:  Procedure Laterality Date   APPENDECTOMY     CARDIAC STENTS     X2   IVC FILTER INSERTION     TONSILLECTOMY      Prior to Admission medications   Medication Sig Start Date End Date Taking? Authorizing Provider  carvedilol (COREG) 6.25 MG tablet Take 6.25 mg by mouth 2 (two) times daily. 08/21/19  Yes [provider]  furosemide (LASIX) 20 MG tablet Take 20 mg by mouth daily.   Yes [provider]  ipratropium (ATROVENT HFA) 17 MCG/ACT inhaler Inhale 2 puffs into the lungs every 6 (six) hours as needed for wheezing.     [provider]  rivaroxaban (XARELTO) 20 MG TABS tablet Take 20 mg by mouth daily. 07/16/17   [provider]    Allergies as of 07/06/2021   (No Known Allergies)    History reviewed. No pertinent family history.  Social History   Socioeconomic History   Marital status: Married    Spouse name: Not on file   Number of children: Not on file   Years of education: Not on file   Highest education level: Not on file  Occupational History   Not on file  Tobacco Use   Smoking status: Never   Smokeless tobacco: Never  Vaping Use   Vaping Use: Never used  Substance and Sexual Activity   Alcohol use: Not Currently   Drug use: Never   Sexual activity: Not on file  Other Topics Concern   Not on file  Social History Narrative   Not on file   Social Determinants of Health   Financial Resource Strain: Not on file  Food  Insecurity: Not on file  Transportation Needs: Not on file  Physical Activity: Not on file  Stress: Not on file  Social Connections: Not on file  Intimate Partner Violence: Not on file    Review of Systems: See HPI, otherwise negative ROS  Physical Exam: BP (!) 131/94   Pulse 72   Temp (!) 96.6 F (35.9 C)   Resp 20   Ht 5\' 7"  (1.702 m)   Wt 112.9 kg   SpO2 100%   BMI 39.00 kg/m  General:   Alert,  pleasant and cooperative in NAD Head:  Normocephalic and atraumatic. Neck:  Supple; no masses or thyromegaly. Lungs:  Clear throughout to auscultation, normal respiratory effort.    Heart:  +S1, +S2, Regular rate and rhythm, No edema. Abdomen:  Soft, nontender and nondistended. Normal bowel sounds, without guarding, and without rebound.   Neurologic:  Alert and  oriented x4;  grossly normal neurologically.  Impression/Plan: Benjamin Delacruz is here for an colonoscopy to be performed for surveillance due to prior history of colon polyps   Risks, benefits, limitations, and alternatives regarding  colonoscopy have been reviewed with the patient.  Questions have been answered.  All parties agreeable.   Jonathon Bellows, MD  08/09/2021, 8:50 AM

## 2021-08-10 ENCOUNTER — Encounter: Payer: Self-pay | Admitting: Gastroenterology

## 2021-08-10 LAB — SURGICAL PATHOLOGY

## 2021-08-22 ENCOUNTER — Encounter: Payer: Self-pay | Admitting: Gastroenterology

## 2021-11-07 ENCOUNTER — Encounter: Payer: Self-pay | Admitting: Emergency Medicine

## 2021-11-07 ENCOUNTER — Emergency Department
Admission: EM | Admit: 2021-11-07 | Discharge: 2021-11-07 | Disposition: A | Discharge status: Home / Self Care | Payer: Managed Care, Other (non HMO) | Attending: Emergency Medicine | Admitting: Emergency Medicine

## 2021-11-07 ENCOUNTER — Emergency Department: Payer: Managed Care, Other (non HMO)

## 2021-11-07 DIAGNOSIS — Z20822 Contact with and (suspected) exposure to covid-19: Secondary | ICD-10-CM | POA: Diagnosis not present

## 2021-11-07 DIAGNOSIS — Z7901 Long term (current) use of anticoagulants: Secondary | ICD-10-CM | POA: Insufficient documentation

## 2021-11-07 DIAGNOSIS — I10 Essential (primary) hypertension: Secondary | ICD-10-CM | POA: Diagnosis not present

## 2021-11-07 DIAGNOSIS — J40 Bronchitis, not specified as acute or chronic: Secondary | ICD-10-CM | POA: Diagnosis not present

## 2021-11-07 DIAGNOSIS — R059 Cough, unspecified: Secondary | ICD-10-CM | POA: Diagnosis present

## 2021-11-07 LAB — RESP PANEL BY RT-PCR (FLU A&B, COVID) ARPGX2
Influenza A by PCR: NEGATIVE
Influenza B by PCR: NEGATIVE
SARS Coronavirus 2 by RT PCR: NEGATIVE

## 2021-11-07 MED ORDER — GUAIFENESIN-DM 100-10 MG/5ML PO SYRP: 5.0000 mL | ORAL_SOLUTION | ORAL | 0 refills | Status: DC | PRN

## 2021-11-07 NOTE — ED Triage Notes (Addendum)
Pt c/o cough, fever and nasal congestion x2 weeks with no relief by OTC medications. Pt also reports he has tried an previous prescription of tessalon pearls with no relief as well. Pt seen at [facility] and diagnosed with "common cold," with no prescriptions.

## 2021-11-07 NOTE — Discharge Instructions (Addendum)
Your chest x-ray did not show pneumonia.  I suspect that you have bronchitis from a virus.  The cough can last for several weeks.  Please try the Robitussin for cough.  You can also try honey.

## 2021-11-07 NOTE — ED Provider Notes (Signed)
[facility] Provider Note    Event Date/Time   First MD Initiated Contact with Patient 11/07/21 1744     (approximate)   History   Cough and Fever   HPI  Han Vejar is a 56 y.o. male  with pmh of PE/DVT on xarelto, HTN, MI who presents with cough. Symptoms started 2 weeks ago. He has had had persistent cough that is productive of green sputum. Tells me he was seen at [facility] and dx with viral syndrome. Had a subjective fever once last week but no ongoing fevers. Denies CP. Eating well.      Past Medical History:  Diagnosis Date   DVT (deep venous thrombosis) (HCC)    Hypercholesteremia    MI (myocardial infarction) ([place])    Pulmonary embolism (HCC)    Sleep apnea     There are no problems to display for this patient.    Physical Exam  Triage Vital Signs: ED Triage Vitals  Enc Vitals Group     BP 11/07/21 0105 (!) 147/89     Pulse Rate 11/07/21 0105 81     Resp 11/07/21 0105 20     Temp 11/07/21 0105 98.2 F (36.8 C)     Temp Source 11/07/21 0105 Oral     SpO2 11/07/21 0105 98 %     Weight 11/07/21 0118 240 lb (108.9 kg)     Height 11/07/21 0118 5\' 7"  (1.702 m)     Head Circumference --      Peak Flow --      Pain Score 11/07/21 0602 0     Pain Loc --      Pain Edu? --      Excl. in [place]? --     Most recent vital signs: Vitals:   11/07/21 0105  BP: (!) 147/89  Pulse: 81  Resp: 20  Temp: 98.2 F (36.8 C)  SpO2: 98%     General: Awake, no distress.  CV:  Good peripheral perfusion.  Resp:  Normal effort. Lungs are clear. No increased WOB Abd:  No distention.  Neuro:             Awake, Alert, Oriented x 3  Other:     ED Results / Procedures / Treatments  Labs (all labs ordered are listed, but only abnormal results are displayed) Labs Reviewed  RESP PANEL BY RT-PCR (FLU A&B, COVID) [id number]     EKG     RADIOLOGY I reviewed the CXR which does not show any acute cardiopulmonary process; agree with radiology report      PROCEDURES:  Critical Care performed: No  Procedures  The patient is on the cardiac monitor to evaluate for evidence of arrhythmia and/or significant heart rate changes.   MEDICATIONS ORDERED IN ED: Medications - No data to display   IMPRESSION / MDM / [place] / ED COURSE  I reviewed the triage vital signs and the nursing notes.                              Differential diagnosis includes, but is not limited to, bronchitis, PNA, viral syndrome  56 yo male who presents with 2 weeks of cough. He has no dyspnea or CP. VS are wnl and he looks well with clear lungs. CXR obtained today and reviewed by myself has no e/o infiltrate. COVID and flu testing is negative. Suspect bronchitis given the lingering cough without infiltrate.  No indication for abx for treatment as there is no PNA. Patient has no other symptoms of CP, dyspnea, or other systemic symptoms to suggest ACS or PE. He is most bothered by cough at night and has tried tessalon pearls and guaifenesin. I recommended he try honey and dextromethorphan for symptom management. He does not require admission or further work-up at this time.    FINAL CLINICAL IMPRESSION(S) / ED DIAGNOSES   Final diagnoses:  Bronchitis     Rx / DC Orders   ED Discharge Orders          Ordered    guaiFENesin-dextromethorphan (ROBITUSSIN DM) 100-10 MG/5ML syrup  Every 4 hours PRN        11/07/21 0531             Note:  This document was prepared using Dragon voice recognition software and may include unintentional dictation errors.   Rada Hay, MD 11/07/21 1750

## 2022-07-18 IMAGING — DX DG KNEE COMPLETE 4+V*L*
4 series · 4 of 4 positions shown · non-contrast
Comparison: None.

CLINICAL DATA: Left knee pain after twisting injury

EXAM:
LEFT KNEE - COMPLETE 4+ VIEW

[knee ap]
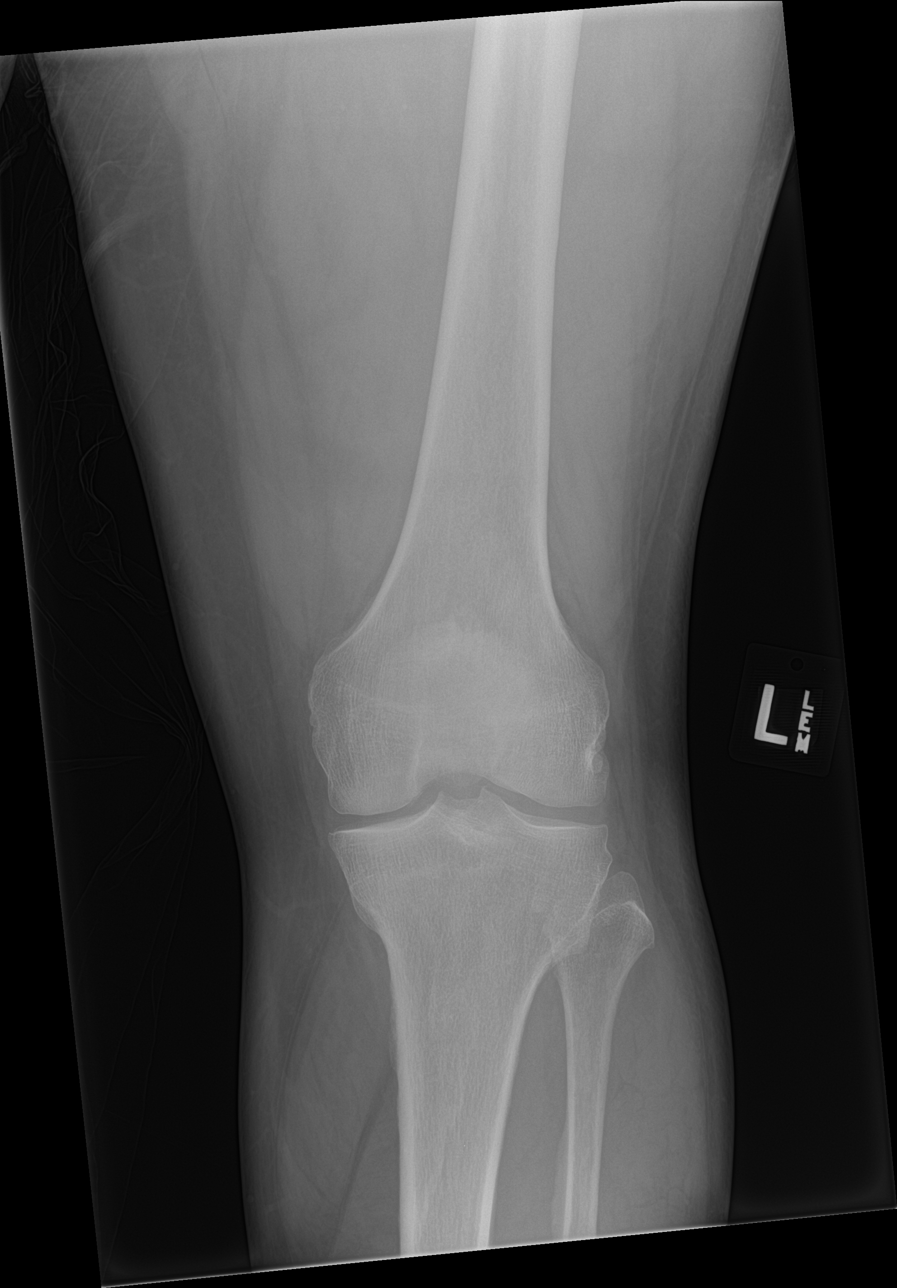

[knee lat]
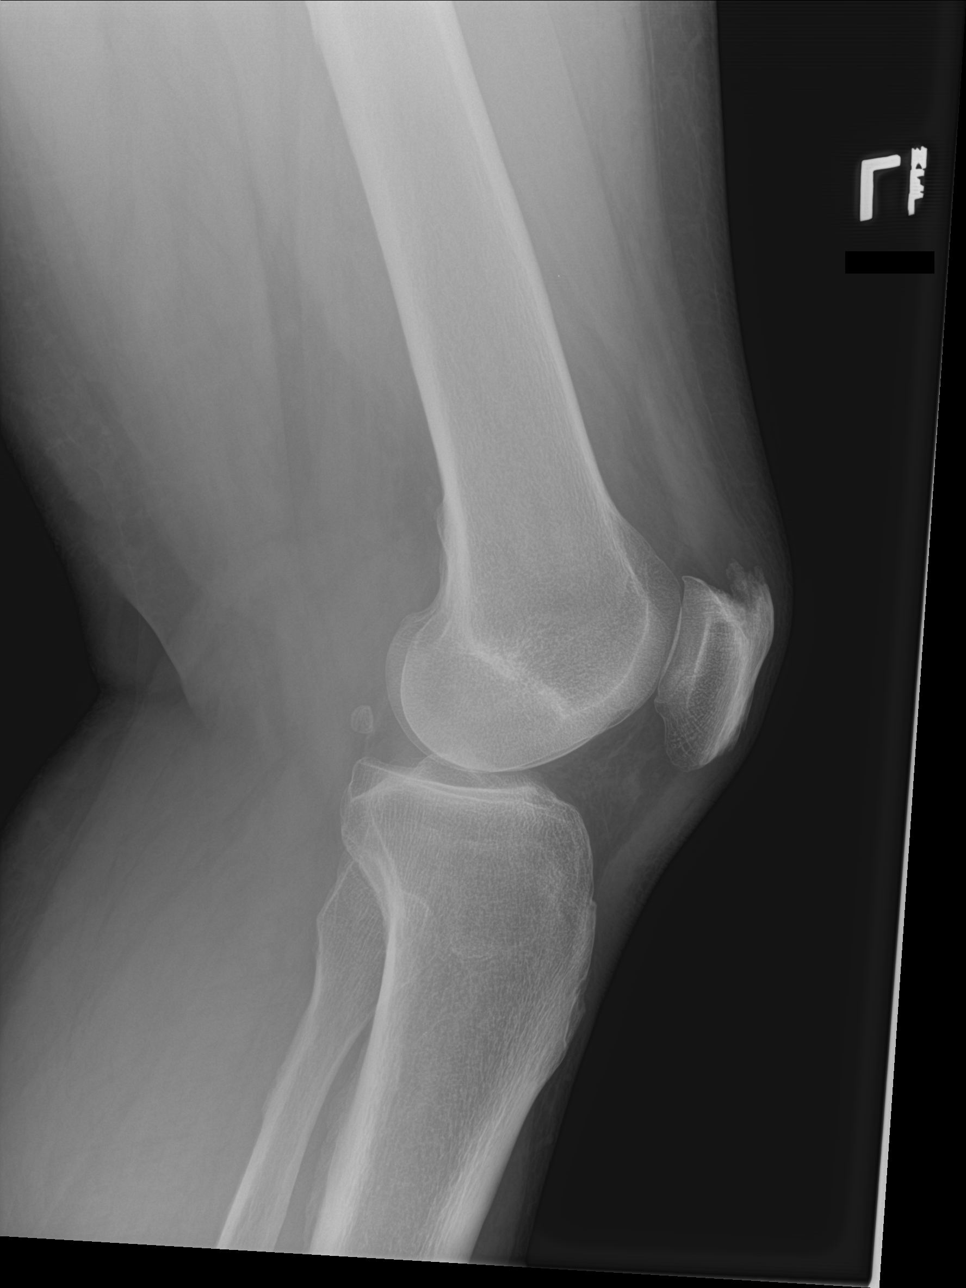

[knee obl (1 of 2)]
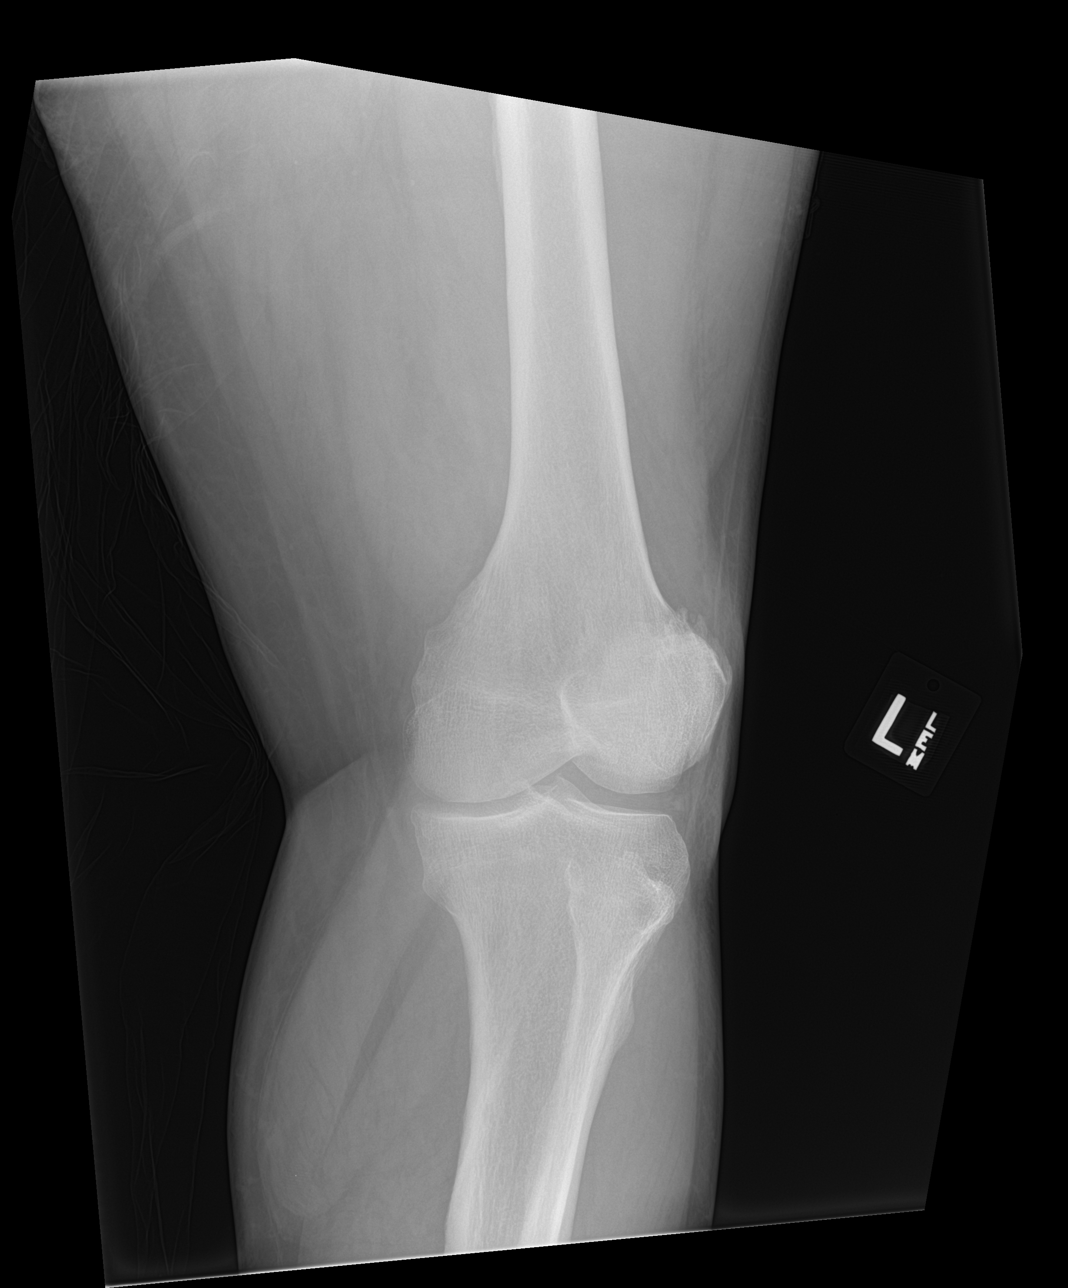

[knee obl (2 of 2)]
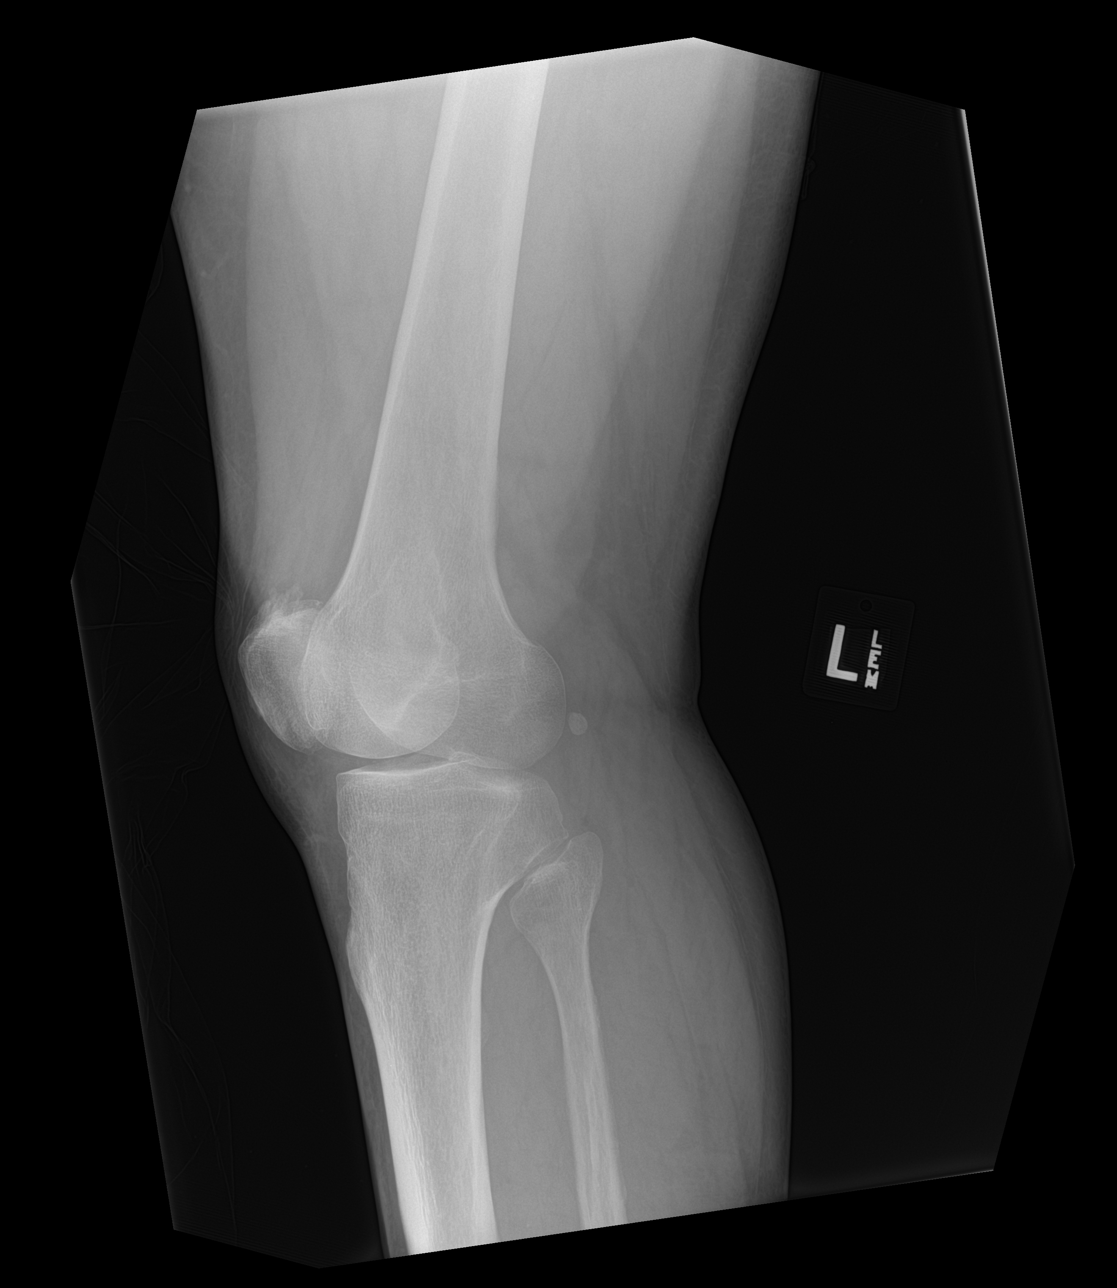

[4 of 4 positions shown; findings below may reference images not displayed]

FINDINGS: No fracture, joint effusion or dislocation. No suspicious focal
osseous lesions. Moderate superior left patellar enthesophyte. No
significant degenerative left knee arthropathy. No radiopaque
foreign bodies.
IMPRESSION: No left knee fracture, joint effusion or malalignment. Moderate
superior left patellar enthesophyte.

## 2022-08-21 ENCOUNTER — Emergency Department: Payer: Managed Care, Other (non HMO)

## 2022-08-21 ENCOUNTER — Emergency Department
Admission: EM | Admit: 2022-08-21 | Discharge: 2022-08-21 | Disposition: A | Discharge status: Home / Self Care | Payer: Managed Care, Other (non HMO) | Attending: Emergency Medicine | Admitting: Emergency Medicine

## 2022-08-21 ENCOUNTER — Other Ambulatory Visit: Payer: Self-pay

## 2022-08-21 ENCOUNTER — Encounter: Payer: Self-pay | Admitting: Emergency Medicine

## 2022-08-21 DIAGNOSIS — R0602 Shortness of breath: Secondary | ICD-10-CM | POA: Insufficient documentation

## 2022-08-21 DIAGNOSIS — R2241 Localized swelling, mass and lump, right lower limb: Secondary | ICD-10-CM | POA: Diagnosis not present

## 2022-08-21 DIAGNOSIS — I2510 Atherosclerotic heart disease of native coronary artery without angina pectoris: Secondary | ICD-10-CM | POA: Diagnosis not present

## 2022-08-21 DIAGNOSIS — R0789 Other chest pain: Secondary | ICD-10-CM | POA: Insufficient documentation

## 2022-08-21 DIAGNOSIS — M79604 Pain in right leg: Secondary | ICD-10-CM | POA: Diagnosis not present

## 2022-08-21 DIAGNOSIS — R079 Chest pain, unspecified: Secondary | ICD-10-CM

## 2022-08-21 LAB — CBC WITH DIFFERENTIAL/PLATELET
Abs Immature Granulocytes: 0.03 10*3/uL (ref 0.00–0.07)
Basophils Absolute: 0 10*3/uL (ref 0.0–0.1)
Basophils Relative: 1 %
Eosinophils Absolute: 0.3 10*3/uL (ref 0.0–0.5)
Eosinophils Relative: 4 %
HCT: 43.5 % (ref 39.0–52.0)
Hemoglobin: 14.1 g/dL (ref 13.0–17.0)
Immature Granulocytes: 0 %
Lymphocytes Relative: 25 %
Lymphs Abs: 1.8 10*3/uL (ref 0.7–4.0)
MCH: 28.7 pg (ref 26.0–34.0)
MCHC: 32.4 g/dL (ref 30.0–36.0)
MCV: 88.4 fL (ref 80.0–100.0)
Monocytes Absolute: 0.5 10*3/uL (ref 0.1–1.0)
Monocytes Relative: 6 %
Neutro Abs: 4.5 10*3/uL (ref 1.7–7.7)
Neutrophils Relative %: 64 %
Platelets: 211 10*3/uL (ref 150–400)
RBC: 4.92 MIL/uL (ref 4.22–5.81)
RDW: 13.7 % (ref 11.5–15.5)
WBC: 7 10*3/uL (ref 4.0–10.5)
nRBC: 0 % (ref 0.0–0.2)

## 2022-08-21 LAB — BASIC METABOLIC PANEL
Anion gap: 2 — ABNORMAL LOW (ref 5–15)
BUN: 14 mg/dL (ref 6–20)
CO2: 25 mmol/L (ref 22–32)
Calcium: 9 mg/dL (ref 8.9–10.3)
Chloride: 114 mmol/L — ABNORMAL HIGH (ref 98–111)
Creatinine, Ser: 1.07 mg/dL (ref 0.61–1.24)
GFR, Estimated: >60 mL/min (ref >60)
Glucose, Bld: 104 mg/dL — ABNORMAL HIGH (ref 70–99)
Potassium: 4.2 mmol/L (ref 3.5–5.1)
Sodium: 141 mmol/L (ref 135–145)

## 2022-08-21 LAB — TROPONIN I (HIGH SENSITIVITY): Troponin I (High Sensitivity): 4 ng/L (ref <18)

## 2022-08-21 MED ORDER — IOHEXOL 350 MG/ML SOLN
75.0000 mL | Freq: Once | INTRAVENOUS | Status: AC | PRN
  Administered 2022-08-21: 75 mL via INTRAVENOUS

## 2022-08-21 MED ORDER — MORPHINE SULFATE (PF) 4 MG/ML IV SOLN
4.0000 mg | Freq: Once | INTRAVENOUS | Status: AC
  Administered 2022-08-21: 4 mg via INTRAVENOUS
  Filled 2022-08-21: qty 1

## 2022-08-21 MED ORDER — HYDROCODONE-ACETAMINOPHEN 5-325 MG PO TABS: 1.0000 | ORAL_TABLET | ORAL | 0 refills | Status: DC | PRN

## 2022-08-21 MED ORDER — ONDANSETRON HCL 4 MG/2ML IJ SOLN
4.0000 mg | Freq: Once | INTRAMUSCULAR | Status: AC
  Administered 2022-08-21: 4 mg via INTRAVENOUS
  Filled 2022-08-21: qty 2

## 2022-08-21 NOTE — ED Notes (Signed)
Pt A&O, IV removed, pt given discharge instructions, pt ambulating with steady gait. 

## 2022-08-21 NOTE — ED Provider Notes (Signed)
[facility] Provider Note    Event Date/Time   First MD Initiated Contact with Patient 08/21/22 [phone]     (approximate)  History   Chief Complaint: No chief complaint on file.  HPI  Benjamin Delacruz is a 56 y.o. male with a past medical history of multiple DVTs and PE previously as well as CAD with a prior MI, hyperlipidemia, presents to the emergency department for right leg pain and right chest pain.  According to the patient over the past few months he has been experiencing pain and swelling in his right lower extremity.  He states for the past 2 days now he is experiencing pain in his right chest worse with deep inspiration as well as a feeling of some mild shortness of breath.  Patient states a history of multiple DVTs and PEs in the past.  Has been on Xarelto but states his doctor took him off Xarelto approximately 9 months ago.  Physical Exam   Triage Vital Signs: ED Triage Vitals  Enc Vitals Group     BP 08/21/22 0616 (!) 162/102     Pulse Rate 08/21/22 0616 82     Resp 08/21/22 0616 18     Temp 08/21/22 0616 98.2 F (36.8 C)     Temp Source 08/21/22 0616 Oral     SpO2 08/21/22 0616 98 %     Weight 08/21/22 0617 243 lb (110.2 kg)     Height 08/21/22 0617 '5\' 7"'$  (1.702 m)     Head Circumference --      Peak Flow --      Pain Score --      Pain Loc --      Pain Edu? --      Excl. in [place]? --     Most recent vital signs: Vitals:   08/21/22 0616  BP: (!) 162/102  Pulse: 82  Resp: 18  Temp: 98.2 F (36.8 C)  SpO2: 98%    General: Awake, no distress. CV:  Good peripheral perfusion.  Regular rate and rhythm  Resp:  Normal effort.  Equal breath sounds bilaterally.  Abd:  No distention.  Soft, nontender.  No rebound or guarding. Other:  Mild right lower extremity swelling with mild tenderness to palpation especially behind the right calf.   ED Results / Procedures / Treatments   EKG  EKG viewed and interpreted by myself shows a normal sinus  rhythm at 75 bpm with a narrow QRS, normal axis, normal intervals, no concerning ST changes.  RADIOLOGY  I have reviewed and interpreted the CT images of the chest I do not see any large pulmonary embolism on my evaluation. Radiology is read the CT scan is negative for PE   MEDICATIONS ORDERED IN ED: Medications  morphine (PF) 4 MG/ML injection 4 mg (has no administration in time range)  ondansetron (ZOFRAN) injection 4 mg (has no administration in time range)     IMPRESSION / MDM / ASSESSMENT AND PLAN / ED COURSE  I reviewed the triage vital signs and the nursing notes.  Patient's presentation is most consistent with acute presentation with potential threat to life or bodily function.  Patient presents to the emergency department for right leg pain and now right-sided chest pain worse with deep inspiration.  Patient has a history of multiple PEs and DVTs per patient in the past has been off anticoagulation for 9 months.  Given the patient's complaints and history we will obtain an ultrasound the right lower  extremity we will check labs including troponin.  We will obtain a CTA of the chest to evaluate for pulmonary embolism.  We will treat pain while awaiting results.  Differential would include DVT, PE, ACS, chest wall pain, musculoskeletal pain.  Patient's lab work is reassuring including normal CBC, reassuring chemistry and a negative troponin.  Patient CT scan of the chest is negative for PE and ultrasound is negative for DVT.  Given the reassuring work-up I believe the patient could be discharged with PCP follow-up he has an appointment in 2 days with a primary care doctor.  We will prescribe a very short course of pain medication for likely chest wall/musculoskeletal discomfort.  Discussed my typical chest pain return precautions.  FINAL CLINICAL IMPRESSION(S) / ED DIAGNOSES   Chest pain   Note:  This document was prepared using Dragon voice recognition software and may include  unintentional dictation errors.   Harvest Dark, MD 08/21/22 1153

## 2022-08-21 NOTE — ED Notes (Signed)
Light green and lavender top obtained and sent to lab at this time by this tech.

## 2022-08-21 NOTE — ED Triage Notes (Signed)
Pt states he has a history of PE, DVT. Pt states he is having pain on his right side of chest when taking a deep breath. Pt states is not taking any anticoagulant. Pt appears in no acute distress.

## 2022-08-23 ENCOUNTER — Ambulatory Visit (INDEPENDENT_AMBULATORY_CARE_PROVIDER_SITE_OTHER): Payer: Managed Care, Other (non HMO) | Admitting: Family Medicine

## 2022-08-23 VITALS — BP 144/84 | HR 79 | Ht 67.0 in | Wt 252.4 lb

## 2022-08-23 DIAGNOSIS — Z7901 Long term (current) use of anticoagulants: Secondary | ICD-10-CM | POA: Insufficient documentation

## 2022-08-23 DIAGNOSIS — Z86718 Personal history of other venous thrombosis and embolism: Secondary | ICD-10-CM | POA: Insufficient documentation

## 2022-08-23 DIAGNOSIS — B351 Tinea unguium: Secondary | ICD-10-CM

## 2022-08-23 DIAGNOSIS — E7800 Pure hypercholesterolemia, unspecified: Secondary | ICD-10-CM

## 2022-08-23 DIAGNOSIS — I10 Essential (primary) hypertension: Secondary | ICD-10-CM | POA: Insufficient documentation

## 2022-08-23 DIAGNOSIS — Z7689 Persons encountering health services in other specified circumstances: Secondary | ICD-10-CM

## 2022-08-23 DIAGNOSIS — Z86711 Personal history of pulmonary embolism: Secondary | ICD-10-CM | POA: Insufficient documentation

## 2022-08-23 DIAGNOSIS — J3089 Other allergic rhinitis: Secondary | ICD-10-CM

## 2022-08-23 DIAGNOSIS — K219 Gastro-esophageal reflux disease without esophagitis: Secondary | ICD-10-CM

## 2022-08-23 MED ORDER — HYDROCHLOROTHIAZIDE 25 MG PO TABS: 25.0000 mg | ORAL_TABLET | Freq: Every day | ORAL | 3 refills | Status: DC

## 2022-08-23 MED ORDER — XARELTO 20 MG PO TABS: 20.0000 mg | ORAL_TABLET | Freq: Every day | ORAL | 3 refills | Status: DC

## 2022-08-23 MED ORDER — PANTOPRAZOLE SODIUM 40 MG PO TBEC: 40.0000 mg | DELAYED_RELEASE_TABLET | Freq: Every day | ORAL | 3 refills | Status: DC

## 2022-08-23 MED ORDER — ATORVASTATIN CALCIUM 20 MG PO TABS: 20.0000 mg | ORAL_TABLET | Freq: Every day | ORAL | 3 refills | Status: AC

## 2022-08-23 MED ORDER — CARVEDILOL 6.25 MG PO TABS: 6.2500 mg | ORAL_TABLET | Freq: Two times a day (BID) | ORAL | 3 refills | Status: DC

## 2022-08-23 MED ORDER — TERBINAFINE HCL 250 MG PO TABS: 250.0000 mg | ORAL_TABLET | Freq: Every day | ORAL | 0 refills | Status: DC

## 2022-08-23 MED ORDER — IPRATROPIUM BROMIDE 0.06 % NA SOLN: 2.0000 | Freq: Four times a day (QID) | NASAL | 5 refills | Status: DC | PRN

## 2022-08-23 NOTE — Patient Instructions (Addendum)
Thank you for coming to the office today.  Restart Xarelto (brand) '20mg'$  daily for 90 day supply with +3 refills for up to 1 year. Activate the copay discount card, online or by phone or bring to pharmacy and they can activate it.  STOP Taking Furosemide '20mg'$  (fluid pill) - KEEP it for the future. Only take if significant worsening swelling, on a short term basis. May take one a day for 1 to 3+ days if need then stop again.  NEW medicine Hydrochlorothiazide (HCTZ) '25mg'$  once daily (morning) - this can actually help swelling as well.  Keep on Carvedilol 6.'25mg'$  twice a day. Refilled.  Refilled Atorvastatin (Cholesterol), Pantoprazole (Acid reflux), Refilled Nasal Spray  Fingernail fungal infection - start Terbinafine '250mg'$  daily for 6 weeks (45 pills)  DUE for FASTING BLOOD WORK (no food or drink after midnight before the lab appointment, only water or coffee without cream/sugar on the morning of)  SCHEDULE "Lab Only" visit in the morning at the clinic for lab draw in 4 WEEKS   - Make sure Lab Only appointment is at about 1 week before your next appointment, so that results will be available  For Lab Results, once available within 2-3 days of blood draw, you can can log in to MyChart online to view your results and a brief explanation. Also, we can discuss results at next follow-up visit.    Please schedule a Follow-up Appointment to: Return in about 4 weeks (around 09/20/2022) for 3-4 weeks fasting lab only then 1 week later Annual Physical.  If you have any other questions or concerns, please feel free to call the office or send a message through [place]. You may also schedule an earlier appointment if necessary.  Additionally, you may be receiving a survey about your experience at our office within a few days to 1 week by e-mail or mail. We value your feedback.  Nobie Putnam, DO [place]

## 2022-08-23 NOTE — Progress Notes (Signed)
Subjective:    Patient ID: Benjamin Delacruz, male    DOB: Jun 12, 1966, 56 y.o.   MRN: [id number]  Evo Gendron is a 56 y.o. male presenting on 08/23/2022 for Establish Care  Previously with PCP at [facility]  HPI  History of Blood Clot, Recurrent History Right Lower Extremity DVT History of PE He was doing well on anticoagulation 20mg  daily Xarelto but previous PCP taken off of his Xarelto blood thinner medication for about 9-10 months. He had history of traumatic injury to RLE and provoked blood clot.  CHRONIC HTN: Reports some elevated BP, some swelling LE Current Meds - Carvedilol 6.25mg  BID, Furosemide 20mg  daily   Reports good compliance, took meds today. Tolerating well, w/o complaints. Denies CP, dyspnea, HA, edema, dizziness / lightheadedness  HYPERLIPIDEMIA: - Reports no concerns. Previuosly controlled - Currently taking Atorvastatin 20mg  daily, tolerating well without side effects or myalgias  Fingernail Onychomycosis Discolored thickened fingernails, asking for treatment.      No data to display          Past Medical History:  Diagnosis Date   DVT (deep venous thrombosis) (HCC)    Hypercholesteremia    MI (myocardial infarction) (HCC)    Pulmonary embolism (HCC)    Sleep apnea    Past Surgical History:  Procedure Laterality Date   APPENDECTOMY     CARDIAC STENTS     X2   COLONOSCOPY WITH PROPOFOL N/A 08/09/2021   Procedure: COLONOSCOPY WITH PROPOFOL;  Surgeon: Wyline Mood, MD;  Location: [facility] ENDOSCOPY;  Service: Gastroenterology;  Laterality: N/A;   IVC FILTER INSERTION     TONSILLECTOMY     Social History   Socioeconomic History   Marital status: Married    Spouse name: Not on file   Number of children: Not on file   Years of education: Not on file   Highest education level: Not on file  Occupational History   Not on file  Tobacco Use   Smoking status: Never   Smokeless tobacco: Never  Vaping Use   Vaping Use: Never used   Substance and Sexual Activity   Alcohol use: Not Currently   Drug use: Never   Sexual activity: Yes  Other Topics Concern   Not on file  Social History Narrative   Not on file   Social Determinants of Health   Financial Resource Strain: Not on file  Food Insecurity: Not on file  Transportation Needs: Not on file  Physical Activity: Not on file  Stress: Not on file  Social Connections: Not on file  Intimate Partner Violence: Not on file   History reviewed. No pertinent family history. Current Outpatient Medications on File Prior to Visit  Medication Sig   furosemide (LASIX) 20 MG tablet Take 20 mg by mouth daily as needed.   No current facility-administered medications on file prior to visit.    Review of Systems Per HPI unless specifically indicated above      Objective:    BP (!) 144/84 (BP Location: Left Arm, Cuff Size: Normal)   Pulse 79   Ht 5\' 7"  (1.702 m)   Wt 252 lb 6.4 oz (114.5 kg)   SpO2 100%   BMI 39.53 kg/m   Wt Readings from Last 3 Encounters:  08/23/22 252 lb 6.4 oz (114.5 kg)  08/21/22 243 lb (110.2 kg)  11/07/21 240 lb (108.9 kg)    Physical Exam Vitals and nursing note reviewed.  Constitutional:      General: He is not  in acute distress.    Appearance: He is well-developed. He is obese. He is not diaphoretic.     Comments: Well-appearing, comfortable, cooperative  HENT:     Head: Normocephalic and atraumatic.  Eyes:     General:        Right eye: No discharge.        Left eye: No discharge.     Conjunctiva/sclera: Conjunctivae normal.  Neck:     Thyroid: No thyromegaly.  Cardiovascular:     Rate and Rhythm: Normal rate and regular rhythm.     Pulses: Normal pulses.     Heart sounds: Normal heart sounds. No murmur heard. Pulmonary:     Effort: Pulmonary effort is normal. No respiratory distress.     Breath sounds: Normal breath sounds. No wheezing or rales.  Musculoskeletal:        General: Normal range of motion.     Cervical  back: Normal range of motion and neck supple.     Right lower leg: Edema present.  Lymphadenopathy:     Cervical: No cervical adenopathy.  Skin:    General: Skin is warm and dry.     Findings: Lesion (discolored thickened fingernails) present. No erythema or rash.  Neurological:     Mental Status: He is alert and oriented to person, place, and time. Mental status is at baseline.  Psychiatric:        Behavior: Behavior normal.     Comments: Well groomed, good eye contact, normal speech and thoughts     I have personally reviewed the radiology report from 08/21/22.   US Venous Img Lower Unilateral RightPerformed 08/21/2022 Final result  Study Result CLINICAL DATA: RIGHT lower extremity pain and swelling  EXAM: RIGHT LOWER EXTREMITY VENOUS DOPPLER ULTRASOUND  TECHNIQUE: Gray-scale sonography with compression, as well as color and duplex ultrasound, were performed to evaluate the deep venous system(s) from the level of the common femoral vein through the popliteal and proximal calf veins.  COMPARISON: None Available.  FINDINGS: VENOUS  Normal compressibility of the common femoral, superficial femoral, and popliteal veins, as well as the visualized calf veins. Visualized portions of profunda femoral vein and great saphenous vein unremarkable. No filling defects to suggest DVT on grayscale or color Doppler imaging. Doppler waveforms show normal direction of venous flow, normal respiratory plasticity and response to augmentation.  Limited views of the contralateral common femoral vein are unremarkable.  OTHER  None.  Limitations: none  IMPRESSION: No evidence of RIGHT lower extremity deep venous thrombosis.   Electronically Signed By: Genevive Bi M.D. On: 08/21/2022 08:25  -------------------------  CT Angio Chest PE W and/or Wo ContrastPerformed 08/21/2022 Final result  Study Result CLINICAL DATA: Right-sided chest pain on deep breath. History  of pulmonary embolism.  EXAM: CT ANGIOGRAPHY CHEST WITH CONTRAST  TECHNIQUE: Multidetector CT imaging of the chest was performed using the standard protocol during bolus administration of intravenous contrast. Multiplanar CT image reconstructions and MIPs were obtained to evaluate the vascular anatomy.  RADIATION DOSE REDUCTION: This exam was performed according to the departmental dose-optimization program which includes automated exposure control, adjustment of the mA and/or kV according to patient size and/or use of iterative reconstruction technique.  CONTRAST: 75mL OMNIPAQUE IOHEXOL 350 MG/ML SOLN  COMPARISON: None Available.  FINDINGS: Cardiovascular: Satisfactory opacification of the pulmonary arteries to the segmental level. No evidence of pulmonary embolism. Normal heart size. No pericardial effusion.  Mediastinum/Nodes: No enlarged mediastinal, hilar, or axillary lymph nodes. Thyroid gland, trachea, and esophagus demonstrate  no significant findings.  Lungs/Pleura: Bibasilar subsegmental linear atelectasis. Lungs are otherwise clear. No pleural effusion or pneumothorax.  Upper Abdomen: Partially imaged simple cyst in the interpolar region of the left kidney measuring up to 2.2 cm.  Musculoskeletal: Diffuse idiopathic skeletal hyperostosis. No acute osseous abnormality.  Review of the MIP images confirms the above findings.  IMPRESSION: 1. No evidence of pulmonary embolism. 2. Bibasilar subsegmental linear atelectasis. Lungs are otherwise clear. 3. Diffuse idiopathic skeletal hyperostosis. 4. Partially imaged simple cyst in the interpolar region of the left kidney measuring up to 2.2 cm, which does not require follow-up.   Electronically Signed By: Larose Hires D.O. On: 08/21/2022 11:26    Results for orders placed or performed during the hospital encounter of 08/21/22  CBC with Differential/Platelet  Result Value Ref Range   WBC 7.0 4.0 - 10.5 K/uL    RBC 4.92 4.22 - 5.81 MIL/uL   Hemoglobin 14.1 13.0 - 17.0 g/dL   HCT 62.9 52.8 - 41.3 %   MCV 88.4 80.0 - 100.0 fL   MCH 28.7 26.0 - 34.0 pg   MCHC 32.4 30.0 - 36.0 g/dL   RDW 24.4 01.0 - 27.2 %   Platelets 211 150 - 400 K/uL   nRBC 0.0 0.0 - 0.2 %   Neutrophils Relative % 64 %   Neutro Abs 4.5 1.7 - 7.7 K/uL   Lymphocytes Relative 25 %   Lymphs Abs 1.8 0.7 - 4.0 K/uL   Monocytes Relative 6 %   Monocytes Absolute 0.5 0.1 - 1.0 K/uL   Eosinophils Relative 4 %   Eosinophils Absolute 0.3 0.0 - 0.5 K/uL   Basophils Relative 1 %   Basophils Absolute 0.0 0.0 - 0.1 K/uL   Immature Granulocytes 0 %   Abs Immature Granulocytes 0.03 0.00 - 0.07 K/uL  Basic metabolic panel  Result Value Ref Range   Sodium 141 135 - 145 mmol/L   Potassium 4.2 3.5 - 5.1 mmol/L   Chloride 114 (H) 98 - 111 mmol/L   CO2 25 22 - 32 mmol/L   Glucose, Bld 104 (H) 70 - 99 mg/dL   BUN 14 6 - 20 mg/dL   Creatinine, Ser 5.36 0.61 - 1.24 mg/dL   Calcium 9.0 8.9 - 64.4 mg/dL   GFR, Estimated >03 >47 mL/min   Anion gap 2 (L) 5 - 15  Troponin I (High Sensitivity)  Result Value Ref Range   Troponin I (High Sensitivity) 4 <18 ng/L      Assessment & Plan:   Problem List Items Addressed This Visit     Chronic anticoagulation   Relevant Medications   XARELTO 20 MG TABS tablet   Essential hypertension - Primary   Relevant Medications   hydrochlorothiazide (HYDRODIURIL) 25 MG tablet   atorvastatin (LIPITOR) 20 MG tablet   carvedilol (COREG) 6.25 MG tablet   XARELTO 20 MG TABS tablet   History of deep venous thrombosis (DVT) of distal vein of right lower extremity   Relevant Medications   XARELTO 20 MG TABS tablet   History of pulmonary embolus (PE)   Relevant Medications   XARELTO 20 MG TABS tablet   Pure hypercholesterolemia   Relevant Medications   hydrochlorothiazide (HYDRODIURIL) 25 MG tablet   atorvastatin (LIPITOR) 20 MG tablet   carvedilol (COREG) 6.25 MG tablet   XARELTO 20 MG TABS tablet    Other Visit Diagnoses     Encounter to establish care with new doctor       Seasonal allergic rhinitis  due to other allergic trigger       Relevant Medications   ipratropium (ATROVENT) 0.06 % nasal spray   Gastroesophageal reflux disease without esophagitis       Relevant Medications   pantoprazole (PROTONIX) 40 MG tablet   Onychomycosis       Relevant Medications   terbinafine (LAMISIL) 250 MG tablet       Establish care with new PCP  Recurrent DVT, PE history Prior was provoked but given has documented both DVT and PE has done well on chronic anticoagulation Now he remains off, and prefers to resume, no complications  Restart Xarelto (brand) 20mg  daily for 90 day supply with +3 refills for up to 1 year. Activate the copay discount card, online or by phone or bring to pharmacy and they can activate it.  HTN No obvious significant chronic swelling, some mild increased R side given prior DVT Elevated BP  STOP Taking Furosemide 20mg  (fluid pill) - KEEP it for the future. Only take if significant worsening swelling, on a short term basis. May take one a day for 1 to 3+ days if need then stop again.  NEW medicine Hydrochlorothiazide (HCTZ) 25mg  once daily (morning) - this can actually help swelling as well.  Keep on Carvedilol 6.25mg  twice a day. Refilled.  HLD / GERD Refilled Atorvastatin (Cholesterol), Pantoprazole (Acid reflux), Refilled Nasal Spray  Onychomycosis Fingernail fungal infection - start Terbinafine 250mg  daily for 6 weeks (45 pills)  Meds ordered this encounter  Medications   hydrochlorothiazide (HYDRODIURIL) 25 MG tablet    Sig: Take 1 tablet (25 mg total) by mouth daily.    Dispense:  90 tablet    Refill:  3   atorvastatin (LIPITOR) 20 MG tablet    Sig: Take 1 tablet (20 mg total) by mouth at bedtime.    Dispense:  90 tablet    Refill:  3   carvedilol (COREG) 6.25 MG tablet    Sig: Take 1 tablet (6.25 mg total) by mouth 2 (two) times daily.     Dispense:  180 tablet    Refill:  3   pantoprazole (PROTONIX) 40 MG tablet    Sig: Take 1 tablet (40 mg total) by mouth daily before breakfast.    Dispense:  90 tablet    Refill:  3   ipratropium (ATROVENT) 0.06 % nasal spray    Sig: Place 2 sprays into both nostrils 4 (four) times daily as needed for rhinitis.    Dispense:  15 mL    Refill:  5   terbinafine (LAMISIL) 250 MG tablet    Sig: Take 1 tablet (250 mg total) by mouth daily. For 6 weeks for fingernail    Dispense:  45 tablet    Refill:  0   XARELTO 20 MG TABS tablet    Sig: Take 1 tablet (20 mg total) by mouth daily with supper.    Dispense:  90 tablet    Refill:  3      Follow up plan: Return in about 4 weeks (around 09/20/2022) for 3-4 weeks fasting lab only then 1 week later Annual Physical.  Future labs ordered 09/27/22  Saralyn Pilar, DO [facility] 08/23/2022, 10:02 AM

## 2022-08-24 ENCOUNTER — Encounter: Payer: Self-pay | Admitting: Family Medicine

## 2022-08-24 ENCOUNTER — Other Ambulatory Visit: Payer: Self-pay | Admitting: Family Medicine

## 2022-08-24 DIAGNOSIS — R7309 Other abnormal glucose: Secondary | ICD-10-CM

## 2022-08-24 DIAGNOSIS — Z0000 Encounter for general adult medical examination without abnormal findings: Secondary | ICD-10-CM

## 2022-08-24 DIAGNOSIS — Z7901 Long term (current) use of anticoagulants: Secondary | ICD-10-CM

## 2022-08-24 DIAGNOSIS — Z86718 Personal history of other venous thrombosis and embolism: Secondary | ICD-10-CM

## 2022-08-24 DIAGNOSIS — I10 Essential (primary) hypertension: Secondary | ICD-10-CM

## 2022-08-24 DIAGNOSIS — R351 Nocturia: Secondary | ICD-10-CM

## 2022-08-24 DIAGNOSIS — E7800 Pure hypercholesterolemia, unspecified: Secondary | ICD-10-CM

## 2022-09-26 ENCOUNTER — Other Ambulatory Visit: Payer: Self-pay

## 2022-09-26 DIAGNOSIS — E7800 Pure hypercholesterolemia, unspecified: Secondary | ICD-10-CM

## 2022-09-26 DIAGNOSIS — R351 Nocturia: Secondary | ICD-10-CM

## 2022-09-26 DIAGNOSIS — R7309 Other abnormal glucose: Secondary | ICD-10-CM

## 2022-09-26 DIAGNOSIS — I10 Essential (primary) hypertension: Secondary | ICD-10-CM

## 2022-09-26 DIAGNOSIS — Z86718 Personal history of other venous thrombosis and embolism: Secondary | ICD-10-CM

## 2022-09-26 DIAGNOSIS — Z7901 Long term (current) use of anticoagulants: Secondary | ICD-10-CM

## 2022-09-26 DIAGNOSIS — Z0000 Encounter for general adult medical examination without abnormal findings: Secondary | ICD-10-CM

## 2022-09-27 ENCOUNTER — Other Ambulatory Visit: Payer: Medicare Other

## 2022-09-28 LAB — LIPID PANEL
Cholesterol: 248 mg/dL — ABNORMAL HIGH (ref <200)
HDL: 48 mg/dL (ref >=40)
LDL Cholesterol (Calc): 159 mg/dL (calc) — ABNORMAL HIGH
Non-HDL Cholesterol (Calc): 200 mg/dL (calc) — ABNORMAL HIGH (ref <130)
Total CHOL/HDL Ratio: 5.2 (calc) — ABNORMAL HIGH (ref <5.0)
Triglycerides: 248 mg/dL — ABNORMAL HIGH (ref <150)

## 2022-09-28 LAB — CBC WITH DIFFERENTIAL/PLATELET
Absolute Monocytes: 378 cells/uL (ref 200–950)
Basophils Absolute: 32 cells/uL (ref 0–200)
Basophils Relative: 0.5 %
Eosinophils Absolute: 277 cells/uL (ref 15–500)
Eosinophils Relative: 4.4 %
HCT: 41.4 % (ref 38.5–50.0)
Hemoglobin: 14.2 g/dL (ref 13.2–17.1)
Lymphs Abs: 1909 cells/uL (ref 850–3900)
MCH: 29.8 pg (ref 27.0–33.0)
MCHC: 34.3 g/dL (ref 32.0–36.0)
MCV: 87 fL (ref 80.0–100.0)
MPV: 9.5 fL (ref 7.5–12.5)
Monocytes Relative: 6 %
Neutro Abs: 3704 cells/uL (ref 1500–7800)
Neutrophils Relative %: 58.8 %
Platelets: 209 10*3/uL (ref 140–400)
RBC: 4.76 10*6/uL (ref 4.20–5.80)
RDW: 13.7 % (ref 11.0–15.0)
Total Lymphocyte: 30.3 %
WBC: 6.3 10*3/uL (ref 3.8–10.8)

## 2022-09-28 LAB — TSH: TSH: 1.02 mIU/L (ref 0.40–4.50)

## 2022-09-28 LAB — COMPLETE METABOLIC PANEL WITH GFR
AG Ratio: 1.6 (calc) (ref 1.0–2.5)
ALT: 11 U/L (ref 9–46)
AST: 17 U/L (ref 10–35)
Albumin: 4 g/dL (ref 3.6–5.1)
Alkaline phosphatase (APISO): 100 U/L (ref 35–144)
BUN: 13 mg/dL (ref 7–25)
CO2: 24 mmol/L (ref 20–32)
Calcium: 9.1 mg/dL (ref 8.6–10.3)
Chloride: 107 mmol/L (ref 98–110)
Creat: 1.19 mg/dL (ref 0.70–1.30)
Globulin: 2.5 g/dL (calc) (ref 1.9–3.7)
Glucose, Bld: 164 mg/dL — ABNORMAL HIGH (ref 65–99)
Potassium: 3.8 mmol/L (ref 3.5–5.3)
Sodium: 140 mmol/L (ref 135–146)
Total Bilirubin: 0.6 mg/dL (ref 0.2–1.2)
Total Protein: 6.5 g/dL (ref 6.1–8.1)
eGFR: 71 mL/min/{1.73_m2} (ref >=60)

## 2022-09-28 LAB — PSA: PSA: 0.76 ng/mL (ref <=4.00)

## 2022-09-28 LAB — HEMOGLOBIN A1C
Hgb A1c MFr Bld: 5.5 % of total Hgb (ref <5.7)
Mean Plasma Glucose: 111 mg/dL
eAG (mmol/L): 6.2 mmol/L

## 2022-10-04 ENCOUNTER — Other Ambulatory Visit: Payer: Self-pay | Admitting: Family Medicine

## 2022-10-04 ENCOUNTER — Encounter: Payer: Self-pay | Admitting: Family Medicine

## 2022-10-04 ENCOUNTER — Ambulatory Visit (INDEPENDENT_AMBULATORY_CARE_PROVIDER_SITE_OTHER): Payer: Managed Care, Other (non HMO) | Admitting: Family Medicine

## 2022-10-04 VITALS — BP 123/78 | HR 80 | Ht 67.0 in | Wt 251.8 lb

## 2022-10-04 DIAGNOSIS — I10 Essential (primary) hypertension: Secondary | ICD-10-CM

## 2022-10-04 DIAGNOSIS — R7309 Other abnormal glucose: Secondary | ICD-10-CM

## 2022-10-04 DIAGNOSIS — E7800 Pure hypercholesterolemia, unspecified: Secondary | ICD-10-CM

## 2022-10-04 DIAGNOSIS — Z86711 Personal history of pulmonary embolism: Secondary | ICD-10-CM

## 2022-10-04 DIAGNOSIS — Z86718 Personal history of other venous thrombosis and embolism: Secondary | ICD-10-CM

## 2022-10-04 DIAGNOSIS — Z23 Encounter for immunization: Secondary | ICD-10-CM

## 2022-10-04 DIAGNOSIS — Z7901 Long term (current) use of anticoagulants: Secondary | ICD-10-CM

## 2022-10-04 NOTE — Assessment & Plan Note (Signed)
Without recurrence On chronic anticoagulation Xarelto

## 2022-10-04 NOTE — Assessment & Plan Note (Signed)
Uncontrolled cholesterol on statin, goal to improve diet lifestyle Last lipid panel 09/2022 The 10-year ASCVD risk score (Arnett DK, et al., 2019) is: 12.4%   Plan: 1. Continue current meds - Atorvastatin '20mg'$  daily, he was off for months, now had resumed 2. On Xarelto 3. Encourage improved lifestyle - low carb/cholesterol, reduce portion size, continue improving regular exercise  F/u 6 month repeat lipid

## 2022-10-04 NOTE — Patient Instructions (Addendum)
Thank you for coming to the office today.  Flu Shot today  Lab results overall look good Main issue is the LDL cholesterol 159, should be closer to 100 Keep limiting cholesterol intake in diet Continue working weight loss  No change to medication today  Re check cholesterol in 6 months  DUE for FASTING BLOOD WORK (no food or drink after midnight before the lab appointment, only water or coffee without cream/sugar on the morning of)  SCHEDULE "Lab Only" visit in the morning at the clinic for lab draw in 6 MONTHS   - Make sure Lab Only appointment is at about 1 week before your next appointment, so that results will be available  For Lab Results, once available within 2-3 days of blood draw, you can can log in to MyChart online to view your results and a brief explanation. Also, we can discuss results at next follow-up visit.   Please schedule a Follow-up Appointment to: Return in about 6 months (around 04/04/2023) for 6 month fasting lab then 1 week later Follow-up HLD, Labs, Wt.  If you have any other questions or concerns, please feel free to call the office or send a message through [place]. You may also schedule an earlier appointment if necessary.  Additionally, you may be receiving a survey about your experience at our office within a few days to 1 week by e-mail or mail. We value your feedback.  Nobie Putnam, DO [facility], [facility]  Heart-Healthy Eating Plan Many factors influence your heart health, including eating and exercise habits. Heart health is also called coronary health. Coronary risk increases with abnormal blood fat (lipid) levels. A heart-healthy eating plan includes limiting unhealthy fats, increasing healthy fats, limiting salt (sodium) intake, and making other diet and lifestyle changes. What is my plan? Your health care provider may recommend that: You limit your fat intake to _________% or less of your total calories each day. You  limit your saturated fat intake to _________% or less of your total calories each day. You limit the amount of cholesterol in your diet to less than _________ mg per day. You limit the amount of sodium in your diet to less than _________ mg per day. What are tips for following this plan? Cooking Cook foods using methods other than frying. Baking, boiling, grilling, and broiling are all good options. Other ways to reduce fat include: Removing the skin from poultry. Removing all visible fats from meats. Steaming vegetables in water or broth. Meal planning  At meals, imagine dividing your plate into fourths: Fill one-half of your plate with vegetables and green salads. Fill one-fourth of your plate with whole grains. Fill one-fourth of your plate with lean protein foods. Eat 2-4 cups of vegetables per day. One cup of vegetables equals 1 cup (91 g) broccoli or cauliflower florets, 2 medium carrots, 1 large bell pepper, 1 large sweet potato, 1 large tomato, 1 medium white potato, 2 cups (150 g) raw leafy greens. Eat 1-2 cups of fruit per day. One cup of fruit equals 1 small apple, 1 large banana, 1 cup (237 g) mixed fruit, 1 large orange,  cup (82 g) dried fruit, 1 cup (240 mL) 100% fruit juice. Eat more foods that contain soluble fiber. Examples include apples, broccoli, carrots, beans, peas, and barley. Aim to get 25-30 g of fiber per day. Increase your consumption of legumes, nuts, and seeds to 4-5 servings per week. One serving of dried beans or legumes equals  cup (90  g) cooked, 1 serving of nuts is  oz (12 almonds, 24 pistachios, or 7 walnut halves), and 1 serving of seeds equals  oz (8 g). Fats Choose healthy fats more often. Choose monounsaturated and polyunsaturated fats, such as olive and canola oils, avocado oil, flaxseeds, walnuts, almonds, and seeds. Eat more omega-3 fats. Choose salmon, mackerel, sardines, tuna, flaxseed oil, and ground flaxseeds. Aim to eat fish at least 2 times  each week. Check food labels carefully to identify foods with trans fats or high amounts of saturated fat. Limit saturated fats. These are found in animal products, such as meats, butter, and cream. Plant sources of saturated fats include palm oil, palm kernel oil, and coconut oil. Avoid foods with partially hydrogenated oils in them. These contain trans fats. Examples are stick margarine, some tub margarines, cookies, crackers, and other baked goods. Avoid fried foods. General information Eat more home-cooked food and less restaurant, buffet, and fast food. Limit or avoid alcohol. Limit foods that are high in added sugar and simple starches such as foods made using white refined flour (white breads, pastries, sweets). Lose weight if you are overweight. Losing just 5-10% of your body weight can help your overall health and prevent diseases such as diabetes and heart disease. Monitor your sodium intake, especially if you have high blood pressure. Talk with your health care provider about your sodium intake. Try to incorporate more vegetarian meals weekly. What foods should I eat? Fruits All fresh, canned (in natural juice), or frozen fruits. Vegetables Fresh or frozen vegetables (raw, steamed, roasted, or grilled). Green salads. Grains Most grains. Choose whole wheat and whole grains most of the time. Rice and pasta, including brown rice and pastas made with whole wheat. Meats and other proteins Lean, well-trimmed beef, veal, pork, and lamb. Chicken and [place] without skin. All fish and shellfish. Wild duck, rabbit, pheasant, and venison. Egg whites or low-cholesterol egg substitutes. Dried beans, peas, lentils, and tofu. Seeds and most nuts. Dairy Low-fat or nonfat cheeses, including ricotta and mozzarella. Skim or 1% milk (liquid, powdered, or evaporated). Buttermilk made with low-fat milk. Nonfat or low-fat yogurt. Fats and oils Non-hydrogenated (trans-free) margarines. Vegetable oils,  including soybean, sesame, sunflower, olive, avocado, peanut, safflower, corn, canola, and cottonseed. Salad dressings or mayonnaise made with a vegetable oil. Beverages Water (mineral or sparkling). Coffee and tea. Unsweetened ice tea. Diet beverages. Sweets and desserts Sherbet, gelatin, and fruit ice. Small amounts of dark chocolate. Limit all sweets and desserts. Seasonings and condiments All seasonings and condiments. The items listed above may not be a complete list of foods and beverages you can eat. Contact a dietitian for more options. What foods should I avoid? Fruits Canned fruit in heavy syrup. Fruit in cream or butter sauce. Fried fruit. Limit coconut. Vegetables Vegetables cooked in cheese, cream, or butter sauce. Fried vegetables. Grains Breads made with saturated or trans fats, oils, or whole milk. Croissants. Sweet rolls. Donuts. High-fat crackers, such as cheese crackers and chips. Meats and other proteins Fatty meats, such as hot dogs, ribs, sausage, bacon, rib-eye roast or steak. High-fat deli meats, such as salami and bologna. Caviar. Domestic duck and goose. Organ meats, such as liver. Dairy Cream, sour cream, cream cheese, and creamed cottage cheese. Whole-milk cheeses. Whole or 2% milk (liquid, evaporated, or condensed). Whole buttermilk. Cream sauce or high-fat cheese sauce. Whole-milk yogurt. Fats and oils Meat fat, or shortening. Cocoa butter, hydrogenated oils, palm oil, coconut oil, palm kernel oil. Solid fats and shortenings, including bacon  fat, salt pork, lard, and butter. Nondairy cream substitutes. Salad dressings with cheese or sour cream. Beverages Regular sodas and any drinks with added sugar. Sweets and desserts Frosting. Pudding. Cookies. Cakes. Pies. Milk chocolate or white chocolate. Buttered syrups. Full-fat ice cream or ice cream drinks. The items listed above may not be a complete list of foods and beverages to avoid. Contact a dietitian for more  information. Summary Heart-healthy meal planning includes limiting unhealthy fats, increasing healthy fats, limiting salt (sodium) intake and making other diet and lifestyle changes. Lose weight if you are overweight. Losing just 5-10% of your body weight can help your overall health and prevent diseases such as diabetes and heart disease. Focus on eating a balance of foods, including fruits and vegetables, low-fat or nonfat dairy, lean protein, nuts and legumes, whole grains, and heart-healthy oils and fats. This information is not intended to replace advice given to you by your health care provider. Make sure you discuss any questions you have with your health care provider. Document Revised: 11/28/2021 Document Reviewed: 11/28/2021 Elsevier Patient Education  [place].

## 2022-10-04 NOTE — Assessment & Plan Note (Signed)
BMI >39 with comorbid high risk factors HLD, HYPERTENSION Encourage continued wt loss lifestyle modification

## 2022-10-04 NOTE — Progress Notes (Signed)
Subjective:    Patient ID: Benjamin Delacruz, male    DOB: 1966/09/13, 56 y.o.   MRN: [id number]  Benjamin Delacruz is a 56 y.o. male presenting on 10/04/2022 for Hyperlipidemia   HPI  A1c 5.5 No history of Pre Diabetes  History of Blood Clot, Recurrent History Right Lower Extremity DVT History of PE Benjamin Delacruz was doing well on anticoagulation 31m daily Xarelto but previous PCP taken off of his Xarelto blood thinner medication for about 9-10 months. Benjamin Delacruz had history of traumatic injury to RLE and provoked blood clot.   CHRONIC HTN: Reports some elevated BP, some swelling LE Current Meds - Carvedilol 6.23mBID, Furosemide 2015maily   Reports good compliance, took meds today. Tolerating well, w/o complaints. Denies CP, dyspnea, HA, edema, dizziness / lightheadedness  Morbid Obesity BMI >39 Wt down 10 lbs overall Working on healthy protein shake, meal supplement   HYPERLIPIDEMIA: - Reports no concerns. Previuosly controlled, had been off Statin therapy, last visit with me 08/2022 Benjamin Delacruz was restarted on Statin therapy for about 1 month Lab shows LDL 159, and TG 248 - Currently taking Atorvastatin 41m27mily, tolerating well without side effects or myalgias    Health Maintenance: Last Colonoscopy 08/2021, no polyps, repeat 10 years. 2032  PSA 0.76 (09/2022)  Flu Shot today     10/04/2022    8:26 AM 10/04/2022    8:18 AM  Depression screen PHQ 2/9  Decreased Interest 1 0  Down, Depressed, Hopeless 0 0  PHQ - 2 Score 1 0  Altered sleeping 1   Tired, decreased energy 1   Change in appetite 0   Feeling bad or failure about yourself  0   Trouble concentrating 0   Moving slowly or fidgety/restless 0   Suicidal thoughts 0   PHQ-9 Score 3   Difficult doing work/chores Not difficult at all     Past Medical History:  Diagnosis Date   DVT (deep venous thrombosis) (HCC)    Hypercholesteremia    MI (myocardial infarction) (HCC)[place] Pulmonary embolism (HCC)    Sleep apnea    Past  Surgical History:  Procedure Laterality Date   APPENDECTOMY     CARDIAC STENTS     X2   COLONOSCOPY WITH PROPOFOL N/A 08/09/2021   Procedure: COLONOSCOPY WITH PROPOFOL;  Surgeon: AnnaJonathon Bellows;  [facility];  Service: Gastroenterology;  Laterality: N/A;   IVC FILTER INSERTION     TONSILLECTOMY     Social History   Socioeconomic History   Marital status: Married    Spouse name: Not on file   Number of children: Not on file   Years of education: Not on file   Highest education level: Not on file  Occupational History   Not on file  Tobacco Use   Smoking status: Never   Smokeless tobacco: Never  Vaping Use   Vaping Use: Never used  Substance and Sexual Activity   Alcohol use: Not Currently   Drug use: Never   Sexual activity: Yes  Other Topics Concern   Not on file  Social History Narrative   Not on file   Social Determinants of Health   Financial Resource Strain: Not on file  Food Insecurity: Not on file  Transportation Needs: Not on file  Physical Activity: Not on file  Stress: Not on file  Social Connections: Not on file  Intimate Partner Violence: Not on file   History reviewed. No pertinent family history. Current Outpatient Medications on File  Prior to Visit  Medication Sig   atorvastatin (LIPITOR) 20 MG tablet Take 1 tablet (20 mg total) by mouth at bedtime.   carvedilol (COREG) 6.25 MG tablet Take 1 tablet (6.25 mg total) by mouth 2 (two) times daily.   furosemide (LASIX) 20 MG tablet Take 20 mg by mouth daily as needed.   hydrochlorothiazide (HYDRODIURIL) 25 MG tablet Take 1 tablet (25 mg total) by mouth daily.   ipratropium (ATROVENT) 0.06 % nasal spray Place 2 sprays into both nostrils 4 (four) times daily as needed for rhinitis.   pantoprazole (PROTONIX) 40 MG tablet Take 1 tablet (40 mg total) by mouth daily before breakfast.   terbinafine (LAMISIL) 250 MG tablet Take 1 tablet (250 mg total) by mouth daily. For 6 weeks for fingernail    XARELTO 20 MG TABS tablet Take 1 tablet (20 mg total) by mouth daily with supper.   No current facility-administered medications on file prior to visit.    Review of Systems  Constitutional:  Negative for activity change, appetite change, chills, diaphoresis, fatigue and fever.  HENT:  Negative for congestion and hearing loss.   Eyes:  Negative for visual disturbance.  Respiratory:  Negative for cough, chest tightness, shortness of breath and wheezing.   Cardiovascular:  Negative for chest pain, palpitations and leg swelling.  Gastrointestinal:  Negative for abdominal pain, constipation, diarrhea, nausea and vomiting.  Genitourinary:  Negative for dysuria, frequency and hematuria.  Musculoskeletal:  Negative for arthralgias and neck pain.  Skin:  Negative for rash.  Neurological:  Negative for dizziness, weakness, light-headedness, numbness and headaches.  Hematological:  Negative for adenopathy.  Psychiatric/Behavioral:  Negative for behavioral problems, dysphoric mood and sleep disturbance.    Per HPI unless specifically indicated above      Objective:    BP 123/78   Pulse 80   Ht _0  (1.702 m)   Wt 251 lb 12.8 oz (114.2 kg)   SpO2 100%   BMI 39.44 kg/m   Wt Readings from Last 3 Encounters:  10/04/22 251 lb 12.8 oz (114.2 kg)  08/23/22 252 lb 6.4 oz (114.5 kg)  08/21/22 243 lb (110.2 kg)    Physical Exam Vitals and nursing note reviewed.  Constitutional:      General: Benjamin Delacruz is not in acute distress.    Appearance: Benjamin Delacruz is well-developed. Benjamin Delacruz is not diaphoretic.     Comments: Well-appearing, comfortable, cooperative  HENT:     Head: Normocephalic and atraumatic.  Eyes:     General:        Right eye: No discharge.        Left eye: No discharge.     Conjunctiva/sclera: Conjunctivae normal.     Pupils: Pupils are equal, round, and reactive to light.  Neck:     Thyroid: No thyromegaly.     Vascular: No carotid bruit.  Cardiovascular:     Rate and Rhythm: Normal rate  and regular rhythm.     Pulses: Normal pulses.     Heart sounds: Normal heart sounds. No murmur heard. Pulmonary:     Effort: Pulmonary effort is normal. No respiratory distress.     Breath sounds: Normal breath sounds. No wheezing or rales.  Abdominal:     General: Bowel sounds are normal. There is no distension.     Palpations: Abdomen is soft. There is no mass.     Tenderness: There is no abdominal tenderness.  Musculoskeletal:        General: No tenderness. Normal range  of motion.     Cervical back: Normal range of motion and neck supple.     Right lower leg: No edema.     Left lower leg: No edema.     Comments: Upper / Lower Extremities: - Normal muscle tone, strength bilateral upper extremities 5/5, lower extremities 5/5  Lymphadenopathy:     Cervical: No cervical adenopathy.  Skin:    General: Skin is warm and dry.     Findings: No erythema or rash.  Neurological:     Mental Status: Benjamin Delacruz is alert and oriented to person, place, and time.     Comments: Distal sensation intact to light touch all extremities  Psychiatric:        Mood and Affect: Mood normal.        Behavior: Behavior normal.        Thought Content: Thought content normal.     Comments: Well groomed, good eye contact, normal speech and thoughts       Results for orders placed or performed in visit on 09/26/22  TSH  Result Value Ref Range   TSH 1.02 0.40 - 4.50 mIU/L  PSA  Result Value Ref Range   PSA 0.76 < OR = 4.00 ng/mL  Hemoglobin A1c  Result Value Ref Range   Hgb A1c MFr Bld 5.5 <5.7 % of total Hgb   Mean Plasma Glucose 111 mg/dL   eAG (mmol/L) 6.2 mmol/L  Lipid panel  Result Value Ref Range   Cholesterol 248 (H) <200 mg/dL   HDL 48 > OR = 40 mg/dL   Triglycerides 248 (H) <150 mg/dL   LDL Cholesterol (Calc) 159 (H) mg/dL (calc)   Total CHOL/HDL Ratio 5.2 (H) <5.0 (calc)   Non-HDL Cholesterol (Calc) 200 (H) <130 mg/dL (calc)  CBC with Differential/Platelet  Result Value Ref Range   WBC 6.3  3.8 - 10.8 Thousand/uL   RBC 4.76 4.20 - 5.80 Million/uL   Hemoglobin 14.2 13.2 - 17.1 g/dL   HCT 41.4 38.5 - 50.0 %   MCV 87.0 80.0 - 100.0 fL   MCH 29.8 27.0 - 33.0 pg   MCHC 34.3 32.0 - 36.0 g/dL   RDW 13.7 11.0 - 15.0 %   Platelets 209 140 - 400 Thousand/uL   MPV 9.5 7.5 - 12.5 fL   Neutro Abs 3,704 1,500 - 7,800 cells/uL   Lymphs Abs 1,909 850 - 3,900 cells/uL   Absolute Monocytes 378 200 - 950 cells/uL   Eosinophils Absolute 277 15 - 500 cells/uL   Basophils Absolute 32 0 - 200 cells/uL   Neutrophils Relative % 58.8 %   Total Lymphocyte 30.3 %   Monocytes Relative 6.0 %   Eosinophils Relative 4.4 %   Basophils Relative 0.5 %  COMPLETE METABOLIC PANEL WITH GFR  Result Value Ref Range   Glucose, Bld 164 (H) 65 - 99 mg/dL   BUN 13 7 - 25 mg/dL   Creat 1.19 0.70 - 1.30 mg/dL   eGFR 71 > OR = 60 mL/min/1.43m   BUN/Creatinine Ratio SEE NOTE: 6 - 22 (calc)   Sodium 140 135 - 146 mmol/L   Potassium 3.8 3.5 - 5.3 mmol/L   Chloride 107 98 - 110 mmol/L   CO2 24 20 - 32 mmol/L   Calcium 9.1 8.6 - 10.3 mg/dL   Total Protein 6.5 6.1 - 8.1 g/dL   Albumin 4.0 3.6 - 5.1 g/dL   Globulin 2.5 1.9 - 3.7 g/dL (calc)   AG Ratio 1.6 1.0 - 2.5 (  calc)   Total Bilirubin 0.6 0.2 - 1.2 mg/dL   Alkaline phosphatase (APISO) 100 35 - 144 U/L   AST 17 10 - 35 U/L   ALT 11 9 - 46 U/L      Assessment & Plan:   Problem List Items Addressed This Visit     Chronic anticoagulation   Essential hypertension    Well-controlled HTN  No known complications    Plan:  1.  Continue current BP regimen Carvedilol 6.24m TWICE A DAY, Furosemide 242mdaily 2. Encourage improved lifestyle - low sodium diet, regular exercise 3. 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 Continue current meds - Atorvastatin 2083maily, Benjamin Delacruz was off for months, now had resumed 2. On Xarelto 3. Encourage improved lifestyle - low carb/cholesterol, reduce portion size, continue improving regular exercise  F/u 6 month repeat lipid      Other Visit Diagnoses     Needs flu shot       Relevant Orders   Flu Vaccine QUAD [id number](Fluarix, Fluzone & Alfiuria Quad PF) (Completed)       Updated Health Maintenance information Reviewed recent lab results with patient Elevated Glucose, non fasting. But normal range A1c, repeat 6 months. Encouraged improvement to lifestyle with diet and exercise Goal of weight loss  Flu Shot today    No orders of the defined types were placed in this encounter.     Follow up plan: Return in about 6 months (around 04/04/2023) for 6 month fasting lab then 1 week later Follow-up HLD, Labs, Wt.  6 month Lipid, BMET, A1c  AlexNobie Putnam [place] 10/04/2022, 8:09 AM

## 2022-10-04 NOTE — Assessment & Plan Note (Signed)
Well-controlled HTN  No known complications    Plan:  1.  Continue current BP regimen Carvedilol 6.'25mg'$  TWICE A DAY, Furosemide '20mg'$  daily 2. Encourage improved lifestyle - low sodium diet, regular exercise 3. Continue monitor BP outside office, bring readings to next visit, if persistently >140/90 or new symptoms notify office sooner

## 2023-04-04 ENCOUNTER — Ambulatory Visit: Payer: Medicare Other | Admitting: Family Medicine

## 2023-06-28 ENCOUNTER — Other Ambulatory Visit: Payer: Self-pay

## 2023-06-28 ENCOUNTER — Emergency Department: Payer: Managed Care, Other (non HMO)

## 2023-06-28 ENCOUNTER — Inpatient Hospital Stay
Admission: EM | Admit: 2023-06-28 | Discharge: 2023-06-30 | DRG: 176 | Disposition: A | Discharge status: Home / Self Care | Payer: Managed Care, Other (non HMO) | Attending: Osteopathic Medicine | Admitting: Osteopathic Medicine

## 2023-06-28 DIAGNOSIS — Z91128 Patient's intentional underdosing of medication regimen for other reason: Secondary | ICD-10-CM

## 2023-06-28 DIAGNOSIS — E7800 Pure hypercholesterolemia, unspecified: Secondary | ICD-10-CM | POA: Diagnosis present

## 2023-06-28 DIAGNOSIS — I10 Essential (primary) hypertension: Secondary | ICD-10-CM

## 2023-06-28 DIAGNOSIS — I2699 Other pulmonary embolism without acute cor pulmonale: Principal | ICD-10-CM

## 2023-06-28 DIAGNOSIS — E669 Obesity, unspecified: Secondary | ICD-10-CM | POA: Diagnosis present

## 2023-06-28 DIAGNOSIS — Z1152 Encounter for screening for COVID-19: Secondary | ICD-10-CM

## 2023-06-28 DIAGNOSIS — Z955 Presence of coronary angioplasty implant and graft: Secondary | ICD-10-CM

## 2023-06-28 DIAGNOSIS — T45516A Underdosing of anticoagulants, initial encounter: Secondary | ICD-10-CM | POA: Diagnosis present

## 2023-06-28 DIAGNOSIS — N179 Acute kidney failure, unspecified: Secondary | ICD-10-CM | POA: Diagnosis present

## 2023-06-28 DIAGNOSIS — I2510 Atherosclerotic heart disease of native coronary artery without angina pectoris: Secondary | ICD-10-CM | POA: Diagnosis present

## 2023-06-28 DIAGNOSIS — Z800 Family history of malignant neoplasm of digestive organs: Secondary | ICD-10-CM

## 2023-06-28 DIAGNOSIS — M25511 Pain in right shoulder: Secondary | ICD-10-CM

## 2023-06-28 DIAGNOSIS — Z7901 Long term (current) use of anticoagulants: Secondary | ICD-10-CM

## 2023-06-28 DIAGNOSIS — I252 Old myocardial infarction: Secondary | ICD-10-CM

## 2023-06-28 DIAGNOSIS — Z833 Family history of diabetes mellitus: Secondary | ICD-10-CM

## 2023-06-28 DIAGNOSIS — Z86718 Personal history of other venous thrombosis and embolism: Secondary | ICD-10-CM | POA: Diagnosis not present

## 2023-06-28 DIAGNOSIS — Z6838 Body mass index (BMI) 38.0-38.9, adult: Secondary | ICD-10-CM

## 2023-06-28 DIAGNOSIS — G4733 Obstructive sleep apnea (adult) (pediatric): Secondary | ICD-10-CM | POA: Diagnosis present

## 2023-06-28 DIAGNOSIS — Y92009 Unspecified place in unspecified non-institutional (private) residence as the place of occurrence of the external cause: Secondary | ICD-10-CM

## 2023-06-28 DIAGNOSIS — Z79899 Other long term (current) drug therapy: Secondary | ICD-10-CM

## 2023-06-28 LAB — COMPREHENSIVE METABOLIC PANEL
ALT: 13 U/L (ref 0–44)
AST: 23 U/L (ref 15–41)
Albumin: 4.4 g/dL (ref 3.5–5.0)
Alkaline Phosphatase: 87 U/L (ref 38–126)
Anion gap: 10 (ref 5–15)
BUN: 13 mg/dL (ref 6–20)
CO2: 24 mmol/L (ref 22–32)
Calcium: 9.3 mg/dL (ref 8.9–10.3)
Chloride: 105 mmol/L (ref 98–111)
Creatinine, Ser: 1.44 mg/dL — ABNORMAL HIGH (ref 0.61–1.24)
GFR, Estimated: 56 mL/min — ABNORMAL LOW (ref >60)
Glucose, Bld: 88 mg/dL (ref 70–99)
Potassium: 3.8 mmol/L (ref 3.5–5.1)
Sodium: 139 mmol/L (ref 135–145)
Total Bilirubin: 1.3 mg/dL — ABNORMAL HIGH (ref 0.3–1.2)
Total Protein: 7.6 g/dL (ref 6.5–8.1)

## 2023-06-28 LAB — CBC
HCT: 41.4 % (ref 39.0–52.0)
Hemoglobin: 14.1 g/dL (ref 13.0–17.0)
MCH: 29.4 pg (ref 26.0–34.0)
MCHC: 34.1 g/dL (ref 30.0–36.0)
MCV: 86.3 fL (ref 80.0–100.0)
Platelets: 196 10*3/uL (ref 150–400)
RBC: 4.8 MIL/uL (ref 4.22–5.81)
RDW: 12.9 % (ref 11.5–15.5)
WBC: 9.1 10*3/uL (ref 4.0–10.5)
nRBC: 0 % (ref 0.0–0.2)

## 2023-06-28 LAB — SARS CORONAVIRUS 2 BY RT PCR: SARS Coronavirus 2 by RT PCR: NEGATIVE

## 2023-06-28 LAB — TROPONIN I (HIGH SENSITIVITY)
Troponin I (High Sensitivity): 4 ng/L (ref <18)
Troponin I (High Sensitivity): 4 ng/L (ref <18)

## 2023-06-28 MED ORDER — CARVEDILOL 6.25 MG PO TABS
6.2500 mg | ORAL_TABLET | Freq: Two times a day (BID) | ORAL | Status: DC
  Administered 2023-06-29 (×2): 6.25 mg via ORAL
  Filled 2023-06-28 (×2): qty 1

## 2023-06-28 MED ORDER — MORPHINE SULFATE (PF) 4 MG/ML IV SOLN
4.0000 mg | Freq: Once | INTRAVENOUS | Status: AC
  Administered 2023-06-28: 4 mg via INTRAVENOUS
  Filled 2023-06-28: qty 1

## 2023-06-28 MED ORDER — DM-GUAIFENESIN ER 30-600 MG PO TB12: 1.0000 | ORAL_TABLET | Freq: Two times a day (BID) | ORAL | Status: DC | PRN

## 2023-06-28 MED ORDER — HYDROMORPHONE HCL 1 MG/ML IJ SOLN
1.0000 mg | INTRAMUSCULAR | Status: DC | PRN
  Administered 2023-06-29: 1 mg via INTRAVENOUS
  Filled 2023-06-28: qty 1

## 2023-06-28 MED ORDER — ONDANSETRON 4 MG PO TBDP
4.0000 mg | ORAL_TABLET | Freq: Once | ORAL | Status: AC
  Administered 2023-06-28: 4 mg via ORAL
  Filled 2023-06-28: qty 1

## 2023-06-28 MED ORDER — ACETAMINOPHEN 325 MG PO TABS
650.0000 mg | ORAL_TABLET | Freq: Four times a day (QID) | ORAL | Status: DC | PRN
  Administered 2023-06-29 (×3): 650 mg via ORAL
  Filled 2023-06-28 (×3): qty 2

## 2023-06-28 MED ORDER — PANTOPRAZOLE SODIUM 40 MG PO TBEC
40.0000 mg | DELAYED_RELEASE_TABLET | Freq: Every day | ORAL | Status: DC
  Administered 2023-06-29 – 2023-06-30 (×2): 40 mg via ORAL
  Filled 2023-06-28 (×2): qty 1

## 2023-06-28 MED ORDER — OXYCODONE-ACETAMINOPHEN 5-325 MG PO TABS
1.0000 | ORAL_TABLET | ORAL | Status: DC | PRN
  Administered 2023-06-30: 1 via ORAL
  Filled 2023-06-28 (×2): qty 1

## 2023-06-28 MED ORDER — SODIUM CHLORIDE 0.9 % IV SOLN: INTRAVENOUS | Status: DC

## 2023-06-28 MED ORDER — ENOXAPARIN SODIUM 120 MG/0.8ML IJ SOSY
115.0000 mg | PREFILLED_SYRINGE | Freq: Once | INTRAMUSCULAR | Status: DC
  Filled 2023-06-28: qty 0.76

## 2023-06-28 MED ORDER — HYDRALAZINE HCL 20 MG/ML IJ SOLN: 5.0000 mg | INTRAMUSCULAR | Status: DC | PRN

## 2023-06-28 MED ORDER — HEPARIN (PORCINE) 25000 UT/250ML-% IV SOLN
1350.0000 [IU]/h | INTRAVENOUS | Status: DC
  Administered 2023-06-29: 1500 [IU]/h via INTRAVENOUS
  Filled 2023-06-28: qty 250

## 2023-06-28 MED ORDER — OXYCODONE-ACETAMINOPHEN 5-325 MG PO TABS
1.0000 | ORAL_TABLET | Freq: Once | ORAL | Status: AC
  Administered 2023-06-28: 1 via ORAL
  Filled 2023-06-28: qty 1

## 2023-06-28 MED ORDER — HEPARIN BOLUS VIA INFUSION
2700.0000 [IU] | Freq: Once | INTRAVENOUS | Status: AC
  Administered 2023-06-29: 2700 [IU] via INTRAVENOUS
  Filled 2023-06-28: qty 2700

## 2023-06-28 MED ORDER — ATORVASTATIN CALCIUM 20 MG PO TABS
20.0000 mg | ORAL_TABLET | Freq: Every day | ORAL | Status: DC
  Administered 2023-06-29: 20 mg via ORAL
  Filled 2023-06-28: qty 1

## 2023-06-28 MED ORDER — ALBUTEROL SULFATE (2.5 MG/3ML) 0.083% IN NEBU: 2.5000 mg | INHALATION_SOLUTION | RESPIRATORY_TRACT | Status: DC | PRN

## 2023-06-28 MED ORDER — IOHEXOL 350 MG/ML SOLN
75.0000 mL | Freq: Once | INTRAVENOUS | Status: AC | PRN
  Administered 2023-06-28: 75 mL via INTRAVENOUS

## 2023-06-28 MED ORDER — SODIUM CHLORIDE 0.9 % IV BOLUS
1000.0000 mL | Freq: Once | INTRAVENOUS | Status: AC
  Administered 2023-06-28: 1000 mL via INTRAVENOUS

## 2023-06-28 MED ORDER — ONDANSETRON HCL 4 MG/2ML IJ SOLN
4.0000 mg | Freq: Three times a day (TID) | INTRAMUSCULAR | Status: DC | PRN
  Administered 2023-06-29: 4 mg via INTRAVENOUS
  Filled 2023-06-28: qty 2

## 2023-06-28 MED ORDER — ALBUTEROL SULFATE HFA 108 (90 BASE) MCG/ACT IN AERS: 2.0000 | INHALATION_SPRAY | RESPIRATORY_TRACT | Status: DC | PRN

## 2023-06-28 NOTE — ED Triage Notes (Signed)
Pt reports R shoulder and arm pain that radiates to his back. Pt reports yesterday he had some chest pain but does not today. Pt reports hx of blood clots and that he takes xarelto daily. Pt ambulatory to triage. Alert and oriented following commands. Breathing unlabored speaking in full sentences. Pt R radial pulse palpable. Sensation intact.

## 2023-06-28 NOTE — H&P (Addendum)
History and Physical    Benjamin Delacruz VWU:[id number] DOB: [birth year] DOA: 06/28/2023  Referring MD/NP/PA:   PCP: Smitty Cords, DO   Patient coming from:  The patient is coming from home.     Chief Complaint: chest pain and right shoulder pain  HPI: Benjamin Delacruz is a 56 y.o. male with medical history significant of DVT and PE on Xarelto, HTN, HLD, CAD with MNII, OSA not using CPAP, obesity, who presents with chest pain and right shoulder pain.  Pt states that he has right shoulder pain for more than 1 week, no injury.  The shoulder pain is constant, sharp, 10 out of 10 in severity, radiating to the back. He also reports intermittent chest pain in the past 2 days, which is located in the front chest, come and goes, sharp, moderate, nonradiating.  His chest pain and shoulder pain are associated with shortness of breath and lightheadedness.  Chest pain is pleuritic, aggravated by deep breath.  Patient has mild dry cough, no fever or chills.  Denies nausea, vomiting, diarrhea or abdominal pain.  No symptoms of UTI.  Patient has asymmetric leg edema, right leg is worse than the left.  No recent long distance traveling. Patient states that he ran out of his Xarelto in the past 2 days.  Data reviewed independently and ED Course: pt was found to have WBC 9.1, AKI with creatinine 1.44, BUN 13 and GFR 56 (baseline creatinine 1.19 on 09/27/2022)., temperature normal, blood pressure 154/96, heart rate 85, RR 18, oximetry 99% on room air.  Chest x-ray negative.  Right shoulder x-ray is negative for bony fracture.  CTA showed right lower lobe PE without evidence of right heart strain.  Patient is placed on telemetry bed for observation.   EKG: I have personally reviewed.  Sinus rhythm, QTc 437, LAE, LAD.   Review of Systems:   General: no fevers, chills, no body weight gain, has fatigue HEENT: no blurry vision, hearing changes or sore throat Respiratory: has dyspnea, coughing, no wheezing CV:  has chest pain, no palpitations GI: no nausea, vomiting, abdominal pain, diarrhea, constipation GU: no dysuria, burning on urination, increased urinary frequency, hematuria  Ext: has leg edema Neuro: no unilateral weakness, numbness, or tingling, no vision change or hearing loss Skin: no rash, no skin tear. MSK: No muscle spasm, no deformity, no limitation of range of movement in spin Heme: No easy bruising.  Travel history: No recent long distant travel.   Allergy: No Known Allergies  Past Medical History:  Diagnosis Date   DVT (deep venous thrombosis) (HCC)    Hypercholesteremia    MI (myocardial infarction) (HCC)    Pulmonary embolism (HCC)    Sleep apnea     Past Surgical History:  Procedure Laterality Date   APPENDECTOMY     CARDIAC STENTS     X2   COLONOSCOPY WITH PROPOFOL N/A 08/09/2021   Procedure: COLONOSCOPY WITH PROPOFOL;  Surgeon: Wyline Mood, MD;  Location: [facility] ENDOSCOPY;  Service: Gastroenterology;  Laterality: N/A;   IVC FILTER INSERTION     TONSILLECTOMY      Social History:  reports that he has never smoked. He has never used smokeless tobacco. He reports that he does not currently use alcohol. He reports that he does not use drugs.  Family History:  Family History  Problem Relation Age of Onset   Diabetes Mother    Liver cancer Sister      Prior to Admission medications   Medication Sig Start Date  End Date Taking? Authorizing Provider  atorvastatin (LIPITOR) 20 MG tablet Take 1 tablet (20 mg total) by mouth at bedtime. 08/23/22   Karamalegos, Netta Neat, DO  carvedilol (COREG) 6.25 MG tablet Take 1 tablet (6.25 mg total) by mouth 2 (two) times daily. 08/23/22   Karamalegos, Netta Neat, DO  furosemide (LASIX) 20 MG tablet Take 20 mg by mouth daily as needed.    [provider]  hydrochlorothiazide (HYDRODIURIL) 25 MG tablet Take 1 tablet (25 mg total) by mouth daily. 08/23/22   Karamalegos, Netta Neat, DO  ipratropium (ATROVENT) 0.06 %  nasal spray Place 2 sprays into both nostrils 4 (four) times daily as needed for rhinitis. 08/23/22   Karamalegos, Netta Neat, DO  pantoprazole (PROTONIX) 40 MG tablet Take 1 tablet (40 mg total) by mouth daily before breakfast. 08/23/22   Karamalegos, Netta Neat, DO  terbinafine (LAMISIL) 250 MG tablet Take 1 tablet (250 mg total) by mouth daily. For 6 weeks for fingernail 08/23/22   Karamalegos, Alexander J, DO  XARELTO 20 MG TABS tablet Take 1 tablet (20 mg total) by mouth daily with supper. 08/23/22   Smitty Cords, DO    Physical Exam: Vitals:   06/28/23 2130 06/28/23 2200 06/28/23 2230 06/28/23 2300  BP: (!) 161/92 (!) 145/94 (!) 141/89 (!) 142/92  Pulse: 79 73 77 71  Resp: 13 (!) 21 18 17   Temp:   98.2 F (36.8 C)   TempSrc:      SpO2: 100% 100% 99% 100%  Weight:      Height:       General: Not in acute distress HEENT:       Eyes: PERRL, EOMI, no jaundice       ENT: No discharge from the ears and nose, no pharynx injection, no tonsillar enlargement.        Neck: No JVD, no bruit, no mass felt. Heme: No neck lymph node enlargement. Cardiac: S1/S2, RRR, No murmurs, No gallops or rubs. Respiratory: No rales, wheezing, rhonchi or rubs. GI: Soft, nondistended, nontender, no rebound pain, no organomegaly, BS present. GU: No hematuria Ext: Has asymmetric leg edema.  Has mild left leg edema, has 1+ right leg edema Musculoskeletal: No joint deformities, No joint redness or warmth, no limitation of ROM in spin. Has right shoulder pain Skin: No rashes.  Neuro: Alert, oriented X3, cranial nerves II-XII grossly intact, moves all extremities normally. Psych: Patient is not psychotic, no suicidal or hemocidal ideation.  Labs on Admission: I have personally reviewed following labs and imaging studies  CBC: Recent Labs  Lab 06/28/23 2017  WBC 9.1  HGB 14.1  HCT 41.4  MCV 86.3  PLT 196   Basic Metabolic Panel: Recent Labs  Lab 06/28/23 2017  NA 139  K 3.8  CL  105  CO2 24  GLUCOSE 88  BUN 13  CREATININE 1.44*  CALCIUM 9.3   GFR: Estimated Creatinine Clearance: 66.2 mL/min (A) (by C-G formula based on SCr of 1.44 mg/dL (H)). Liver Function Tests: Recent Labs  Lab 06/28/23 2017  AST 23  ALT 13  ALKPHOS 87  BILITOT 1.3*  PROT 7.6  ALBUMIN 4.4   No results for input(s): "LIPASE", "AMYLASE" in the last 168 hours. No results for input(s): "AMMONIA" in the last 168 hours. Coagulation Profile: No results for input(s): "INR", "PROTIME" in the last 168 hours. Cardiac Enzymes: No results for input(s): "CKTOTAL", "CKMB", "CKMBINDEX", "TROPONINI" in the last 168 hours. BNP (last 3 results) No results for input(s): "  PROBNP" in the last 8760 hours. HbA1C: No results for input(s): "HGBA1C" in the last 72 hours. CBG: No results for input(s): "GLUCAP" in the last 168 hours. Lipid Profile: No results for input(s): "CHOL", "HDL", "LDLCALC", "TRIG", "CHOLHDL", "LDLDIRECT" in the last 72 hours. Thyroid Function Tests: No results for input(s): "TSH", "T4TOTAL", "FREET4", "T3FREE", "THYROIDAB" in the last 72 hours. Anemia Panel: No results for input(s): "VITAMINB12", "FOLATE", "FERRITIN", "TIBC", "IRON", "RETICCTPCT" in the last 72 hours. Urine analysis:    Component Value Date/Time   COLORURINE YELLOW (A) 02/16/2020 0825   APPEARANCEUR CLEAR (A) 02/16/2020 0825   LABSPEC 1.016 02/16/2020 0825   PHURINE 5.0 02/16/2020 0825   GLUCOSEU NEGATIVE 02/16/2020 0825   HGBUR NEGATIVE 02/16/2020 0825   BILIRUBINUR NEGATIVE 02/16/2020 0825   KETONESUR NEGATIVE 02/16/2020 0825   PROTEINUR NEGATIVE 02/16/2020 0825   NITRITE NEGATIVE 02/16/2020 0825   LEUKOCYTESUR NEGATIVE 02/16/2020 0825   Sepsis Labs: @LABRCNTIP (procalcitonin:4,lacticidven:4) ) Recent Results (from the past 240 hour(s))  SARS Coronavirus 2 by RT PCR (hospital order, performed in [facility] hospital lab) *cepheid single result test* Anterior Nasal Swab     Status: None   Collection  Time: 06/28/23  9:41 PM   Specimen: Anterior Nasal Swab  Result Value Ref Range Status   SARS Coronavirus 2 by RT PCR NEGATIVE NEGATIVE Final    Comment: (NOTE) SARS-CoV-2 target nucleic acids are NOT DETECTED.  The SARS-CoV-2 RNA is generally detectable in upper and lower respiratory specimens during the acute phase of infection. The lowest concentration of SARS-CoV-2 viral copies this assay can detect is 250 copies / mL. A negative result does not preclude SARS-CoV-2 infection and should not be used as the sole basis for treatment or other patient management decisions.  A negative result may occur with improper specimen collection / handling, submission of specimen other than nasopharyngeal swab, presence of viral mutation(s) within the areas targeted by this assay, and inadequate number of viral copies (<250 copies / mL). A negative result must be combined with clinical observations, patient history, and epidemiological information.  Fact Sheet for Patients:   RoadLapTop.co.za  Fact Sheet for Healthcare Providers: http://kim-miller.com/  This test is not yet approved or  cleared by the [place] FDA and has been authorized for detection and/or diagnosis of SARS-CoV-2 by FDA under an Emergency Use Authorization (EUA).  This EUA will remain in effect (meaning this test can be used) for the duration of the COVID-19 declaration under Section 564(b)(1) of the Act, 21 U.S.C. section 360bbb-3(b)(1), unless the authorization is terminated or revoked sooner.  Performed at [facility], [address] [place], [place] [address]      Radiological Exams on Admission: CT Angio Chest PE W and/or Wo Contrast  Result Date: 06/28/2023 CLINICAL DATA:  Pulmonary embolism (PE) suspected, high prob. Right shoulder, arm and back pain. Chest pain. History of blood clots. EXAM: CT ANGIOGRAPHY CHEST WITH CONTRAST TECHNIQUE: Multidetector  CT imaging of the chest was performed using the standard protocol during bolus administration of intravenous contrast. Multiplanar CT image reconstructions and MIPs were obtained to evaluate the vascular anatomy. RADIATION DOSE REDUCTION: This exam was performed according to the departmental dose-optimization program which includes automated exposure control, adjustment of the mA and/or kV according to patient size and/or use of iterative reconstruction technique. CONTRAST:  75mL OMNIPAQUE IOHEXOL 350 MG/ML SOLN COMPARISON:  08/21/2022 FINDINGS: Cardiovascular: Heart is normal size. Aorta is normal caliber. Segmental filling defect within posterior right lower lobe compatible with pulmonary embolus.  No additional pulmonary emboli. No evidence of right heart strain. Heart is normal size. Aorta normal caliber. Mediastinum/Nodes: No mediastinal, hilar, or axillary adenopathy. Trachea and esophagus are unremarkable. Thyroid unremarkable. Lungs/Pleura: No confluent opacities or effusions. Linear bibasilar atelectasis. Upper Abdomen: No acute findings Musculoskeletal: Chest wall soft tissues are unremarkable. No acute bony abnormality. Review of the MIP images confirms the above findings. IMPRESSION: Right lower lobe pulmonary embolus. No evidence of right heart strain. Bibasilar atelectasis. These results were called by telephone at the time of interpretation on 06/28/2023 at 10:39 pm to provider [facility] , who verbally acknowledged these results. Electronically Signed   By: Charlett Nose M.D.   On: 06/28/2023 22:40   DG Shoulder Right  Result Date: 06/28/2023 CLINICAL DATA:  Shoulder pain.  Radiates to the back. EXAM: RIGHT SHOULDER - 2+ VIEW COMPARISON:  None Available. FINDINGS: There is no evidence of fracture or dislocation. There is no evidence of glenohumeral arthropathy or other focal bone abnormality. There is mild circumferential spurring at the [facility] joint with joint narrowing. Soft tissues are  unremarkable. IMPRESSION: Mild degenerative changes at the [facility] joint. Evidence of fractures is not seen. Electronically Signed   By: Almira Bar M.D.   On: 06/28/2023 22:21   DG Chest 2 View  Result Date: 06/28/2023 CLINICAL DATA:  Hypertension chest pain EXAM: CHEST - 2 VIEW COMPARISON:  11/07/2021 FINDINGS: The heart size and mediastinal contours are within normal limits. Both lungs are clear. The visualized skeletal structures are unremarkable. IMPRESSION: No active cardiopulmonary disease. Electronically Signed   By: Jasmine Pang M.D.   On: 06/28/2023 21:24      Assessment/Plan Principal Problem:   Recurrent pulmonary emboli (HCC) Active Problems:   History of deep venous thrombosis (DVT) of distal vein of right lower extremity   Pure hypercholesterolemia   Essential hypertension   AKI (acute kidney injury) (HCC)   Obesity (BMI 30-39.9)   Assessment and Plan:  Recurrent pulmonary emboli (HCC): CTA showed RLL PE without CT evidence of, but patient reports lightheadedness.  Patient is on Xarelto, ran out of Xarelto for 2 days, but patient's symptoms have been going on for more than 2 days.  Not sure if patient failed Xarelto treatment.  May need to switch to Eliquis.  -Will place on tele bed for obs -heparin drip initiated -2D echocardiogram ordered -LE dopplers ordered to evaluate for DVT -Hypercoag panel -pain control: When necessary Percocet and morphine -prn albuterol nebs and mucinex   History of deep venous thrombosis (DVT) of distal vein of right lower extremity -On IV hepairn  Pure hypercholesterolemia -Lipitor  Essential hypertension -hold HCTZ and Lasix due to AKI -Continue Coreg -IV hydralazine as needed  AKI (acute kidney injury) (HCC) -IV fluid: 1 L normal saline, then 75 cc/h -Hold HCTZ and Lasix  Obesity (BMI 30-39.9): Body weight 112.5 kg, BMI 38.84 -Exercise and healthy diet -Encourage losing weight      DVT ppx: on IV Heparin     Code  Status: Full code    Family Communication: not done, no family member is at bed side.         Disposition Plan:  Anticipate discharge back to previous environment  Consults called:  none  Admission status and Level of care: Telemetry Medical:   for obs    Dispo: The patient is from: Home              Anticipated d/c is to: Home  Anticipated d/c date is: 1 day              Patient currently is not medically stable to d/c.    Severity of Illness:  The appropriate patient status for this patient is OBSERVATION. Observation status is judged to be reasonable and necessary in order to provide the required intensity of service to ensure the patient's safety. The patient's presenting symptoms, physical exam findings, and initial radiographic and laboratory data in the context of their medical condition is felt to place them at decreased risk for further clinical deterioration. Furthermore, it is anticipated that the patient will be medically stable for discharge from the hospital within 2 midnights of admission.        Date of Service 06/29/2023    Lorretta Harp [facility]   If 7PM-7AM, please contact night-coverage [email or web address] 06/29/2023, 12:03 AM

## 2023-06-28 NOTE — ED Notes (Signed)
Pt states he had an episode of cp that lasted for 15 mins. Today and then went away. Pt states later in the afternoon he became dizzy which has decreased. Pts main complaint is shoulder pain that's been going on for two weeks that gets worse with movement and occasionally radiates to upper back.

## 2023-06-28 NOTE — ED Provider Notes (Signed)
[facility] Provider Note    Event Date/Time   First MD Initiated Contact with Patient 06/28/23 2054     (approximate)   History   Chest Pain and Shoulder Pain   HPI  Benjamin Delacruz is a 56 y.o. male with history of multiple DVTs, PE, CAD with prior MI, hypertension, and hyperlipidemia who presents with right shoulder and chest pain.  The patient states that he has had right shoulder pain for about a week.  It is worse when he moves the shoulder and it is a sharp pain.  Over the last couple of days he has had right-sided chest pain as well.  This is sharp and intermittent.  It is associated with shortness of breath over the last day.  The patient states that today he started to have lightheadedness especially when standing up and the pain got worse and more continuous.  He had 1 severe episode when he stood up quickly and felt both very lightheaded and had severe pain in the right shoulder.  It is somewhat subsided compared to then.  The patient is concerned because he has a history of blood clots.  He is on Xarelto and states he is compliant.  He denies any trauma or injury to the shoulder.  I reviewed the past medical records.  The patient was seen in the ED with right-sided chest pain and October of last year.  Workup was negative at that time with negative CT angio of the chest and normal cardiac enzymes.  His most recent outpatient encounter was with Dr. Althea Charon from family medicine on 11/29 of last year for follow-up of his chronic conditions.   Physical Exam   Triage Vital Signs: ED Triage Vitals  Encounter Vitals Group     BP 06/28/23 2010 (!) 156/128     Systolic BP Percentile --      Diastolic BP Percentile --      Pulse Rate 06/28/23 2010 85     Resp 06/28/23 2010 18     Temp 06/28/23 2010 98.3 F (36.8 C)     Temp Source 06/28/23 2010 Oral     SpO2 06/28/23 2010 99 %     Weight 06/28/23 2009 248 lb (112.5 kg)     Height 06/28/23 2009 5\' 7"   (1.702 m)     Head Circumference --      Peak Flow --      Pain Score 06/28/23 2009 10     Pain Loc --      Pain Education --      Exclude from Growth Chart --     Most recent vital signs: Vitals:   06/28/23 2010 06/28/23 2056  BP: (!) 156/128 (!) 154/96  Pulse: 85 79  Resp: 18   Temp: 98.3 F (36.8 C)   SpO2: 99% 100%     General: Awake, no distress.  CV:  Good peripheral perfusion.  Heart sounds. Resp:  Normal effort.  Lungs CTAB. Abd:  No distention.  Other:  No anterior chest wall tenderness.  No bony tenderness or deformity to the shoulder.  Pain on range of motion in abduction and flexion.  Normal motor strength and sensation to the right arm.   ED Results / Procedures / Treatments   Labs (all labs ordered are listed, but only abnormal results are displayed) Labs Reviewed  COMPREHENSIVE METABOLIC PANEL - Abnormal; Notable for the following components:      Result Value   Creatinine, Ser 1.44 (*)  Total Bilirubin 1.3 (*)    GFR, Estimated 56 (*)    All other components within normal limits  SARS CORONAVIRUS 2 BY RT PCR  CBC  HIV ANTIBODY (ROUTINE TESTING W REFLEX)  CBC  BASIC METABOLIC PANEL  TROPONIN I (HIGH SENSITIVITY)  TROPONIN I (HIGH SENSITIVITY)     EKG  ED ECG REPORT I, Dionne Bucy, the attending physician, personally viewed and interpreted this ECG.  Date: 06/28/2023 EKG Time: 2013 Rate: 85 Rhythm: normal sinus rhythm QRS Axis: normal Intervals: normal ST/T Wave abnormalities: normal Narrative Interpretation: no evidence of acute ischemia    RADIOLOGY  Chest x-ray: I independently viewed and interpreted the images; there is no focal consolidation or edema  XR shoulder: No acute fracture.  Mild degenerative changes.  CT angio chest: Acute PE in the right lower lobe with no right heart strain  PROCEDURES:  Critical Care performed: No  Procedures   MEDICATIONS ORDERED IN ED: Medications  albuterol (VENTOLIN HFA) 108  (90 Base) MCG/ACT inhaler 2 puff (has no administration in time range)  dextromethorphan-guaiFENesin (MUCINEX DM) 30-600 MG per 12 hr tablet 1 tablet (has no administration in time range)  ondansetron (ZOFRAN) injection 4 mg (has no administration in time range)  hydrALAZINE (APRESOLINE) injection 5 mg (has no administration in time range)  acetaminophen (TYLENOL) tablet 650 mg (has no administration in time range)  HYDROmorphone (DILAUDID) injection 1 mg (has no administration in time range)  oxyCODONE-acetaminophen (PERCOCET/ROXICET) 5-325 MG per tablet 1 tablet (has no administration in time range)  0.9 %  sodium chloride infusion (has no administration in time range)  oxyCODONE-acetaminophen (PERCOCET/ROXICET) 5-325 MG per tablet 1 tablet (1 tablet Oral Given 06/28/23 2145)  sodium chloride 0.9 % bolus 1,000 mL (1,000 mLs Intravenous New Bag/Given 06/28/23 2205)  ondansetron (ZOFRAN-ODT) disintegrating tablet 4 mg (4 mg Oral Given 06/28/23 2145)  iohexol (OMNIPAQUE) 350 MG/ML injection 75 mL (75 mLs Intravenous Contrast Given 06/28/23 2215)  morphine (PF) 4 MG/ML injection 4 mg (4 mg Intravenous Given 06/28/23 2301)     IMPRESSION / MDM / ASSESSMENT AND PLAN / ED COURSE  I reviewed the triage vital signs and the nursing notes.  57 year old male with PMH as noted above presents with initially right shoulder pain over the last several days then atypical right-sided chest pain, and now with lightheadedness especially on standing.  On exam his vital signs are normal except for hypertension.  He has pain on range of motion of the right shoulder but no chest wall tenderness.  EKG is nonischemic.  Differential diagnosis includes, but is not limited to, musculoskeletal shoulder pain including possible rotator cuff tear or other tendon injury, degenerative disease, arthritis, less likely fracture, neuropathic pain.  I have a lower suspicion for PE given that the shoulder appears to be the original  source of the pain, however given the chest pain, lightheadedness, and the patient's history of DVT and PE, he is at elevated risk.  We will obtain lab workup including cardiac enzymes, COVID swab, chest and shoulder x-rays, and CT angio of the chest to rule out PE.  I have ordered fluids and analgesia.  Patient's presentation is most consistent with acute presentation with potential threat to life or bodily function.  The patient is on the cardiac monitor to evaluate for evidence of arrhythmia and/or significant heart rate changes.  ----------------------------------------- 11:24 PM on 06/28/2023 -----------------------------------------  Lab workup is overall reassuring.  Troponin is negative.  COVID is negative.  CT angio shows new  PE in the right lower lobe.  Given that the patient is already anticoagulated, he will need admission for further workup and treatment.  I consulted Dr. Clyde Lundborg from the hospitalist service; based on our discussion he agrees to evaluate the patient for admission.  We will start the patient on heparin.   FINAL CLINICAL IMPRESSION(S) / ED DIAGNOSES   Final diagnoses:  Other acute pulmonary embolism, unspecified whether acute cor pulmonale present (HCC)  Acute pain of right shoulder     Rx / DC Orders   ED Discharge Orders     None        Note:  This document was prepared using Dragon voice recognition software and may include unintentional dictation errors.    Dionne Bucy, MD 06/28/23 2325

## 2023-06-28 NOTE — Progress Notes (Signed)
ANTICOAGULATION CONSULT NOTE  Pharmacy Consult for heparin infusion Indication: pulmonary embolus  No Known Allergies  Patient Measurements: Height: 5\' 7"  (170.2 cm) Weight: 112.5 kg (248 lb) IBW/kg (Calculated) : 66.1 Heparin Dosing Weight: 91.6 kg  Vital Signs: Temp: 98.2 F (36.8 C) (08/22 2230) Temp Source: Oral (08/22 2010) BP: 142/92 (08/22 2300) Pulse Rate: 71 (08/22 2300)  Labs: Recent Labs    06/28/23 2017 06/28/23 2205  HGB 14.1  --   HCT 41.4  --   PLT 196  --   CREATININE 1.44*  --   TROPONINIHS 4 4    Estimated Creatinine Clearance: 66.2 mL/min (A) (by C-G formula based on SCr of 1.44 mg/dL (H)).   Medical History: Past Medical History:  Diagnosis Date   DVT (deep venous thrombosis) (HCC)    Hypercholesteremia    MI (myocardial infarction) (HCC)    Pulmonary embolism (HCC)    Sleep apnea     Medications:  PTA Meds: Xarelto 20 mg daily, last dose unknown  Assessment: Pt is a 57 yo male with history of multiple DVTs, PE, CAD with prior MI, hypertension, and hyperlipidemia who presents to ED with right shoulder and chest pain and found w/ "right lower lobe pulmonary embolus."   Goal of Therapy:  Heparin level 0.3-0.7 units/ml aPTT 66-102 seconds Monitor platelets by anticoagulation protocol: Yes   Plan:  D/T unknown last dose of Xarelto, will bolus 2700 units x 1 Start heparin infusion at 1500 units/hr Will follow aPTT until correlation w/ HL confirmed Will check aPTT in 6 hr after start of infusion HL & CBC daily while on heparin  Otelia Sergeant, PharmD, [facility] 06/28/2023 11:34 PM

## 2023-06-29 ENCOUNTER — Other Ambulatory Visit (HOSPITAL_COMMUNITY): Payer: Self-pay

## 2023-06-29 ENCOUNTER — Observation Stay (HOSPITAL_COMMUNITY)
Admit: 2023-06-29 | Discharge: 2023-06-29 | Disposition: A | Discharge status: Home / Self Care | Payer: Managed Care, Other (non HMO) | Attending: Osteopathic Medicine | Admitting: Osteopathic Medicine

## 2023-06-29 ENCOUNTER — Encounter: Payer: Self-pay | Admitting: Internal Medicine

## 2023-06-29 ENCOUNTER — Observation Stay: Payer: Managed Care, Other (non HMO)

## 2023-06-29 DIAGNOSIS — Z1152 Encounter for screening for COVID-19: Secondary | ICD-10-CM | POA: Diagnosis not present

## 2023-06-29 DIAGNOSIS — I2609 Other pulmonary embolism with acute cor pulmonale: Secondary | ICD-10-CM | POA: Diagnosis not present

## 2023-06-29 DIAGNOSIS — Z79899 Other long term (current) drug therapy: Secondary | ICD-10-CM | POA: Diagnosis not present

## 2023-06-29 DIAGNOSIS — Z86718 Personal history of other venous thrombosis and embolism: Secondary | ICD-10-CM | POA: Diagnosis not present

## 2023-06-29 DIAGNOSIS — E7800 Pure hypercholesterolemia, unspecified: Secondary | ICD-10-CM | POA: Diagnosis present

## 2023-06-29 DIAGNOSIS — M25511 Pain in right shoulder: Secondary | ICD-10-CM | POA: Diagnosis present

## 2023-06-29 DIAGNOSIS — I2699 Other pulmonary embolism without acute cor pulmonale: Secondary | ICD-10-CM | POA: Diagnosis present

## 2023-06-29 DIAGNOSIS — G4733 Obstructive sleep apnea (adult) (pediatric): Secondary | ICD-10-CM | POA: Diagnosis present

## 2023-06-29 DIAGNOSIS — Z6838 Body mass index (BMI) 38.0-38.9, adult: Secondary | ICD-10-CM | POA: Diagnosis not present

## 2023-06-29 DIAGNOSIS — N179 Acute kidney failure, unspecified: Secondary | ICD-10-CM | POA: Diagnosis present

## 2023-06-29 DIAGNOSIS — Z955 Presence of coronary angioplasty implant and graft: Secondary | ICD-10-CM | POA: Diagnosis not present

## 2023-06-29 DIAGNOSIS — I252 Old myocardial infarction: Secondary | ICD-10-CM | POA: Diagnosis not present

## 2023-06-29 DIAGNOSIS — Y92009 Unspecified place in unspecified non-institutional (private) residence as the place of occurrence of the external cause: Secondary | ICD-10-CM | POA: Diagnosis not present

## 2023-06-29 DIAGNOSIS — Z833 Family history of diabetes mellitus: Secondary | ICD-10-CM | POA: Diagnosis not present

## 2023-06-29 DIAGNOSIS — Z800 Family history of malignant neoplasm of digestive organs: Secondary | ICD-10-CM | POA: Diagnosis not present

## 2023-06-29 DIAGNOSIS — Z7901 Long term (current) use of anticoagulants: Secondary | ICD-10-CM | POA: Diagnosis not present

## 2023-06-29 DIAGNOSIS — I2510 Atherosclerotic heart disease of native coronary artery without angina pectoris: Secondary | ICD-10-CM | POA: Diagnosis present

## 2023-06-29 DIAGNOSIS — I10 Essential (primary) hypertension: Secondary | ICD-10-CM | POA: Diagnosis present

## 2023-06-29 DIAGNOSIS — E669 Obesity, unspecified: Secondary | ICD-10-CM | POA: Diagnosis present

## 2023-06-29 DIAGNOSIS — T45516A Underdosing of anticoagulants, initial encounter: Secondary | ICD-10-CM | POA: Diagnosis present

## 2023-06-29 DIAGNOSIS — Z91128 Patient's intentional underdosing of medication regimen for other reason: Secondary | ICD-10-CM | POA: Diagnosis not present

## 2023-06-29 LAB — HIV ANTIBODY (ROUTINE TESTING W REFLEX): HIV Screen 4th Generation wRfx: NONREACTIVE

## 2023-06-29 LAB — HEPARIN LEVEL (UNFRACTIONATED)
Heparin Unfractionated: <0.1 IU/mL — ABNORMAL LOW (ref 0.30–0.70)
Heparin Unfractionated: 0.67 [IU]/mL (ref 0.30–0.70)
Heparin Unfractionated: 0.97 [IU]/mL — ABNORMAL HIGH (ref 0.30–0.70)
Heparin Unfractionated: >1.1 [IU]/mL — ABNORMAL HIGH (ref 0.30–0.70)

## 2023-06-29 LAB — BASIC METABOLIC PANEL
Anion gap: 9 (ref 5–15)
BUN: 13 mg/dL (ref 6–20)
CO2: 24 mmol/L (ref 22–32)
Calcium: 9 mg/dL (ref 8.9–10.3)
Chloride: 110 mmol/L (ref 98–111)
Creatinine, Ser: 1.29 mg/dL — ABNORMAL HIGH (ref 0.61–1.24)
GFR, Estimated: >60 mL/min (ref >60)
Glucose, Bld: 115 mg/dL — ABNORMAL HIGH (ref 70–99)
Potassium: 4.3 mmol/L (ref 3.5–5.1)
Sodium: 143 mmol/L (ref 135–145)

## 2023-06-29 LAB — ECHOCARDIOGRAM COMPLETE
AR max vel: 3.35 cm2
AV Area VTI: 3.75 cm2
AV Area mean vel: 3.73 cm2
AV Mean grad: 3 mmHg
AV Peak grad: 5.7 mmHg
Ao pk vel: 1.19 m/s
Area-P 1/2: 4.01 cm2
Height: 67 in
MV VTI: 2.85 cm2
S' Lateral: 2.7 cm
Weight: 3968 oz

## 2023-06-29 LAB — APTT: aPTT: 25 seconds (ref 24–36)

## 2023-06-29 LAB — CBC
HCT: 39.5 % (ref 39.0–52.0)
Hemoglobin: 12.9 g/dL — ABNORMAL LOW (ref 13.0–17.0)
MCH: 29.3 pg (ref 26.0–34.0)
MCHC: 32.7 g/dL (ref 30.0–36.0)
MCV: 89.8 fL (ref 80.0–100.0)
Platelets: 165 10*3/uL (ref 150–400)
RBC: 4.4 MIL/uL (ref 4.22–5.81)
RDW: 13 % (ref 11.5–15.5)
WBC: 7.7 10*3/uL (ref 4.0–10.5)
nRBC: 0 % (ref 0.0–0.2)

## 2023-06-29 LAB — ANTITHROMBIN III: AntiThromb III Func: 109 % (ref 75–120)

## 2023-06-29 LAB — PROTIME-INR
INR: 1 (ref 0.8–1.2)
Prothrombin Time: 13.6 seconds (ref 11.4–15.2)

## 2023-06-29 MED ORDER — SODIUM CHLORIDE 0.9 % IV SOLN
12.5000 mg | Freq: Three times a day (TID) | INTRAVENOUS | Status: DC | PRN
  Administered 2023-06-29: 12.5 mg via INTRAVENOUS
  Filled 2023-06-29: qty 0.5

## 2023-06-29 MED ORDER — APIXABAN 5 MG PO TABS
10.0000 mg | ORAL_TABLET | Freq: Once | ORAL | Status: AC
  Administered 2023-06-29: 10 mg via ORAL
  Filled 2023-06-29: qty 2

## 2023-06-29 MED ORDER — APIXABAN 5 MG PO TABS: ORAL_TABLET | ORAL | 0 refills | Status: DC

## 2023-06-29 MED ORDER — APIXABAN 5 MG PO TABS
10.0000 mg | ORAL_TABLET | Freq: Two times a day (BID) | ORAL | Status: DC
  Administered 2023-06-30: 10 mg via ORAL
  Filled 2023-06-29: qty 2

## 2023-06-29 NOTE — Progress Notes (Signed)
ANTICOAGULATION CONSULT NOTE  Pharmacy Consult for heparin infusion Indication: pulmonary embolus  No Known Allergies  Patient Measurements: Height: 5\' 7"  (170.2 cm) Weight: 112.5 kg (248 lb) IBW/kg (Calculated) : 66.1 Heparin Dosing Weight: 91.6 kg  Vital Signs: Temp: 98 F (36.7 C) (08/23 1348) Temp Source: Oral (08/23 1348) BP: 132/74 (08/23 1330) Pulse Rate: 58 (08/23 1330)  Labs: Recent Labs    06/28/23 2017 06/28/23 2205 06/29/23 0017 06/29/23 0554 06/29/23 0743 06/29/23 1425  HGB 14.1  --   --  12.9*  --   --   HCT 41.4  --   --  39.5  --   --   PLT 196  --   --  165  --   --   APTT  --   --  25  --   --   --   LABPROT  --   --  13.6  --   --   --   INR  --   --  1.0  --   --   --   HEPARINUNFRC  --   --  <0.10*  --  0.67 0.97*  CREATININE 1.44*  --   --  1.29*  --   --   TROPONINIHS 4 4  --   --   --   --     Estimated Creatinine Clearance: 73.9 mL/min (A) (by C-G formula based on SCr of 1.29 mg/dL (H)).   Medical History: Past Medical History:  Diagnosis Date   DVT (deep venous thrombosis) (HCC)    Hypercholesteremia    MI (myocardial infarction) (HCC)    Pulmonary embolism (HCC)    Sleep apnea     Medications:  PTA Meds: Xarelto 20 mg daily, last dose unknown  Assessment: Pt is a 57 yo male with history of multiple DVTs, PE, CAD with prior MI, hypertension, and hyperlipidemia who presents to ED with right shoulder and chest pain and found w/ "right lower lobe pulmonary embolus."   Goal of Therapy:  Heparin level 0.3-0.7 units/ml aPTT 66-102 seconds Monitor platelets by anticoagulation protocol: Yes   08/23 0017 Baseline HL < 0.10 08/23 0743 HL 0.67, therapeutic x 1 08/23 1425 HL 0.97, supratherapeutic  Plan:  HL is supratherapeutic Decrease heparin infusion rate to 1350 units/hr Recheck HL in 6 hr after rate change CBC daily while on heparin  Barrie Folk, PharmD 06/29/2023 3:24 PM

## 2023-06-29 NOTE — Progress Notes (Signed)
PROGRESS NOTE    Benjamin Delacruz   QMV:[id number] DOB: February 20, 1966  DOA: 06/28/2023 Date of Service: 06/29/23 PCP: Smitty Cords, DO     Brief Narrative / Hospital Course:  Benjamin Delacruz is a 57 y.o. male with medical history significant of DVT and PE on Xarelto but has not been taking this past 2 days, HTN, HLD, CAD with MNII, OSA not using CPAP, obesity, who presents with x2 days sharp chest pain aggravated by deep breaths, and x1 week of right shoulder pain. Assoc w/ SOB and lightheaded. Patient has asymmetric leg edema, right leg is worse than the left.  08/22: to ED, admitted for RLL PE w/o right heart strain. Also AKI. Echo ordered, Bilateral LE [place] eval DVT, heparin gtt, consideration for switch to Eliquis since only out of Xarelto x2 days and symptoms have been ongoing longer than that.  08/23: Echo no concerns. [place] negative for DVT. AKI resolved. 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   Pure hypercholesterolemia   Essential hypertension   AKI (acute kidney injury) (HCC)   Obesity (BMI 30-39.9)   Recurrent pulmonary emboli (HCC):  Off heparin gtt Switched Xarelto to Eliquis  Hypercoag panel / may need hematology f/u    History of deep venous thrombosis (DVT) of distal vein of right lower extremity Ruled out current DVT  Eliquis   Pure hypercholesterolemia Lipitor   Essential hypertension hold HCTZ and Lasix due to AKI, can probably resume tomorrow Continue Coreg IV hydralazine as needed   AKI (acute kidney injury) (HCC) - resolved IV fluid to continue for now given lightheadeness  Hold HCTZ and Lasix   Obesity (BMI 30-39.9): Body weight 112.5 kg, BMI  38.84 Exercise and healthy diet Encourage losing weight     DVT prophylaxis: Eliquis  Code Status: FULL CODE ACP documentation reviewed: none on file   Current Admission Status: obs --> inpatient  TOC needs / Dispo plan: expect home when stable  Barriers to discharge / significant pending items: leaving on telemetry tonight, will trial ambulation in AM, if only minimal tachycardia would be ok for discharge assuming no other complications overnight              Subjective / Brief ROS:  Patient reports still some tightness in chest Denies CP/SOB.  Denies new weakness.  Tolerating diet.  Reports no concerns w/ urination/defecation.   Family Communication: none at this time     Objective Findings:  Vitals:   06/29/23 1200 06/29/23 1330 06/29/23 1348 06/29/23 1533  BP: 109/74 132/74  128/87  Pulse: (!) 57 (!) 58  (!) 59  Resp: 15 18  15   Temp:   98 F (36.7 C) 97.8 F (36.6 C)  TempSrc:   Oral Oral  SpO2: 98% 98%  99%  Weight:      Height:        Intake/Output Summary (Last 24 hours) at 06/29/2023 1633 Last data filed at 06/29/2023 1545 Gross per 24 hour  Intake 2314.15 ml  Output 800 ml  Net 1514.15 ml   Filed Weights   06/28/23 2009  Weight: 112.5 kg    Examination:  Physical Exam Constitutional:      General:  He is not in acute distress.    Appearance: He is obese.  Cardiovascular:     Rate and Rhythm: Normal rate and regular rhythm.     Heart sounds: Normal heart sounds.  Pulmonary:     Effort: Pulmonary effort is normal.     Breath sounds: Normal breath sounds.  Abdominal:     Palpations: Abdomen is soft.  Skin:    General: Skin is warm and dry.  Neurological:     General: No focal deficit present.     Mental Status: He is alert and oriented to person, place, and time.  Psychiatric:        Mood and Affect: Mood normal.        Behavior: Behavior normal.          Scheduled Medications:   [START ON 06/30/2023] apixaban  10 mg  Oral BID   atorvastatin  20 mg Oral QHS   carvedilol  6.25 mg Oral BID WC   pantoprazole  40 mg Oral QAC breakfast    Continuous Infusions:  sodium chloride 75 mL/hr at 06/29/23 1545   promethazine (PHENERGAN) injection (IM or IVPB) Stopped (06/29/23 0250)    PRN Medications:  acetaminophen, albuterol, dextromethorphan-guaiFENesin, hydrALAZINE, HYDROmorphone (DILAUDID) injection, ondansetron (ZOFRAN) IV, oxyCODONE-acetaminophen, promethazine (PHENERGAN) injection (IM or IVPB)  Antimicrobials from admission:  Anti-infectives (From admission, onward)    None           Data Reviewed:  I have personally reviewed the following...  CBC: Recent Labs  Lab 06/28/23 2017 06/29/23 0554  WBC 9.1 7.7  HGB 14.1 12.9*  HCT 41.4 39.5  MCV 86.3 89.8  PLT 196 165   Basic Metabolic Panel: Recent Labs  Lab 06/28/23 2017 06/29/23 0554  NA 139 143  K 3.8 4.3  CL 105 110  CO2 24 24  GLUCOSE 88 115*  BUN 13 13  CREATININE 1.44* 1.29*  CALCIUM 9.3 9.0   GFR: Estimated Creatinine Clearance: 73.9 mL/min (A) (by C-G formula based on SCr of 1.29 mg/dL (H)). Liver Function Tests: Recent Labs  Lab 06/28/23 2017  AST 23  ALT 13  ALKPHOS 87  BILITOT 1.3*  PROT 7.6  ALBUMIN 4.4   No results for input(s): "LIPASE", "AMYLASE" in the last 168 hours. No results for input(s): "AMMONIA" in the last 168 hours. Coagulation Profile: Recent Labs  Lab 06/29/23 0017  INR 1.0   Cardiac Enzymes: No results for input(s): "CKTOTAL", "CKMB", "CKMBINDEX", "TROPONINI" in the last 168 hours. BNP (last 3 results) No results for input(s): "PROBNP" in the last 8760 hours. HbA1C: No results for input(s): "HGBA1C" in the last 72 hours. CBG: No results for input(s): "GLUCAP" in the last 168 hours. Lipid Profile: No results for input(s): "CHOL", "HDL", "LDLCALC", "TRIG", "CHOLHDL", "LDLDIRECT" in the last 72 hours. Thyroid Function Tests: No results for input(s): "TSH", "T4TOTAL", "FREET4",  "T3FREE", "THYROIDAB" in the last 72 hours. Anemia Panel: No results for input(s): "VITAMINB12", "FOLATE", "FERRITIN", "TIBC", "IRON", "RETICCTPCT" in the last 72 hours. Most Recent Urinalysis On File:     Component Value Date/Time   COLORURINE YELLOW (A) 02/16/2020 0825   APPEARANCEUR CLEAR (A) 02/16/2020 0825   LABSPEC 1.016 02/16/2020 0825   PHURINE 5.0 02/16/2020 0825   GLUCOSEU NEGATIVE 02/16/2020 0825   HGBUR NEGATIVE 02/16/2020 0825   BILIRUBINUR NEGATIVE 02/16/2020 0825   KETONESUR NEGATIVE 02/16/2020 0825   PROTEINUR NEGATIVE 02/16/2020 0825   NITRITE NEGATIVE 02/16/2020 0825   LEUKOCYTESUR NEGATIVE 02/16/2020 0825   Sepsis Labs: @  LABRCNTIP(procalcitonin:4,lacticidven:4) Microbiology: Recent Results (from the past 240 hour(s))  SARS Coronavirus 2 by RT PCR (hospital order, performed in [facility] hospital lab) *cepheid single result test* Anterior Nasal Swab     Status: None   Collection Time: 06/28/23  9:41 PM   Specimen: Anterior Nasal Swab  Result Value Ref Range Status   SARS Coronavirus 2 by RT PCR NEGATIVE NEGATIVE Final    Comment: (NOTE) SARS-CoV-2 target nucleic acids are NOT DETECTED.  The SARS-CoV-2 RNA is generally detectable in upper and lower respiratory specimens during the acute phase of infection. The lowest concentration of SARS-CoV-2 viral copies this assay can detect is 250 copies / mL. A negative result does not preclude SARS-CoV-2 infection and should not be used as the sole basis for treatment or other patient management decisions.  A negative result may occur with improper specimen collection / handling, submission of specimen other than nasopharyngeal swab, presence of viral mutation(s) within the areas targeted by this assay, and inadequate number of viral copies (<250 copies / mL). A negative result must be combined with clinical observations, patient history, and epidemiological information.  Fact Sheet for Patients:    RoadLapTop.co.za  Fact Sheet for Healthcare Providers: http://kim-miller.com/  This test is not yet approved or  cleared by the [place] FDA and has been authorized for detection and/or diagnosis of SARS-CoV-2 by FDA under an Emergency Use Authorization (EUA).  This EUA will remain in effect (meaning this test can be used) for the duration of the COVID-19 declaration under Section 564(b)(1) of the Act, 21 U.S.C. section 360bbb-3(b)(1), unless the authorization is terminated or revoked sooner.  Performed at [facility], [address] [place], [place] [address]       Radiology Studies last 3 days: ECHOCARDIOGRAM COMPLETE  Result Date: 06/29/2023    ECHOCARDIOGRAM REPORT   Patient Name:   Benjamin Delacruz Date of Exam: 06/29/2023 Medical Rec #:  [id number]    Height:       67.0 in Accession #:    [id number]   Weight:       248.0 lb Date of Birth:  Apr 21, 1966     BSA:          2.216 m Patient Age:    57 years     BP:           109/82 mmHg Patient Gender: M            HR:           56 bpm. Exam Location:  [facility] Procedure: 2D Echo, Cardiac Doppler and Color Doppler Indications:     Pulmonary embolus I26.09  History:         Patient has no prior history of Echocardiogram examinations.                  Previous Myocardial Infarction; Risk Factors:Sleep Apnea. DVT,                  pulmonary embolus.  Sonographer:     Cristela Blue Referring Phys:  [id number] Sunnie Nielsen Diagnosing Phys: Lorine Bears MD  Sonographer Comments: Suboptimal apical window. IMPRESSIONS  1. Left ventricular ejection fraction, by estimation, is 60 to 65%. The left ventricle has normal function. The left ventricle has no regional wall motion abnormalities. Left ventricular diastolic parameters were normal.  2. Right ventricular systolic function is normal. The right ventricular size is normal. There is normal pulmonary artery systolic pressure.  3. The mitral valve is  normal in structure. No evidence of mitral valve regurgitation. No evidence of mitral stenosis.  4. The aortic valve is normal in structure. Aortic valve regurgitation is not visualized. No aortic stenosis is present.  5. Aortic dilatation noted. There is mild dilatation of the aortic root, measuring 39 mm. FINDINGS  Left Ventricle: Left ventricular ejection fraction, by estimation, is 60 to 65%. The left ventricle has normal function. The left ventricle has no regional wall motion abnormalities. The left ventricular internal cavity size was normal in size. There is  borderline left ventricular hypertrophy. Left ventricular diastolic parameters were normal. Right Ventricle: The right ventricular size is normal. No increase in right ventricular wall thickness. Right ventricular systolic function is normal. There is normal pulmonary artery systolic pressure. The tricuspid regurgitant velocity is 2.25 m/s, and  with an assumed right atrial pressure of 5 mmHg, the estimated right ventricular systolic pressure is 25.2 mmHg. Left Atrium: Left atrial size was normal in size. Right Atrium: Right atrial size was normal in size. Pericardium: There is no evidence of pericardial effusion. Mitral Valve: The mitral valve is normal in structure. No evidence of mitral valve regurgitation. No evidence of mitral valve stenosis. MV peak gradient, 4.2 mmHg. The mean mitral valve gradient is 2.0 mmHg. Tricuspid Valve: The tricuspid valve is normal in structure. Tricuspid valve regurgitation is trivial. No evidence of tricuspid stenosis. Aortic Valve: The aortic valve is normal in structure. Aortic valve regurgitation is not visualized. No aortic stenosis is present. Aortic valve mean gradient measures 3.0 mmHg. Aortic valve peak gradient measures 5.7 mmHg. Aortic valve area, by VTI measures 3.75 cm. Pulmonic Valve: The pulmonic valve was normal in structure. Pulmonic valve regurgitation is not visualized. No evidence of pulmonic  stenosis. Aorta: Aortic dilatation noted. There is mild dilatation of the aortic root, measuring 39 mm. Venous: The inferior vena cava was not well visualized. IAS/Shunts: No atrial level shunt detected by color flow Doppler.  LEFT VENTRICLE PLAX 2D LVIDd:         4.30 cm   Diastology LVIDs:         2.70 cm   LV e' medial:    6.31 cm/s LV PW:         1.20 cm   LV E/e' medial:  12.7 LV IVS:        1.00 cm   LV e' lateral:   9.36 cm/s LVOT diam:     2.20 cm   LV E/e' lateral: 8.6 LV SV:         84 LV SV Index:   38 LVOT Area:     3.80 cm  RIGHT VENTRICLE RV Basal diam:  3.30 cm RV Mid diam:    3.60 cm RV S prime:     11.30 cm/s TAPSE (M-mode): 2.6 cm LEFT ATRIUM             Index        RIGHT ATRIUM           Index LA diam:        3.60 cm 1.62 cm/m   RA Area:     15.10 cm LA Vol (A2C):   36.1 ml 16.29 ml/m  RA Volume:   36.70 ml  16.56 ml/m LA Vol (A4C):   29.5 ml 13.31 ml/m LA Biplane Vol: 34.4 ml 15.52 ml/m  AORTIC VALVE AV Area (Vmax):    3.35 cm AV Area (Vmean):   3.73 cm AV Area (VTI):     3.75  cm AV Vmax:           119.00 cm/s AV Vmean:          71.700 cm/s AV VTI:            0.223 m AV Peak Grad:      5.7 mmHg AV Mean Grad:      3.0 mmHg LVOT Vmax:         105.00 cm/s LVOT Vmean:        70.400 cm/s LVOT VTI:          0.220 m LVOT/AV VTI ratio: 0.99  AORTA Ao Root diam: 3.70 cm MITRAL VALVE               TRICUSPID VALVE MV Area (PHT): 4.01 cm    TR Peak grad:   20.2 mmHg MV Area VTI:   2.85 cm    TR Vmax:        225.00 cm/s MV Peak grad:  4.2 mmHg MV Mean grad:  2.0 mmHg    SHUNTS MV Vmax:       1.03 m/s    Systemic VTI:  0.22 m MV Vmean:      60.9 cm/s   Systemic Diam: 2.20 cm MV Decel Time: 189 msec MV E velocity: 80.20 cm/s MV A velocity: 57.60 cm/s MV E/A ratio:  1.39 Lorine Bears MD Electronically signed by Lorine Bears MD Signature Date/Time: 06/29/2023/3:19:15 PM    Final    US Venous Img Lower Bilateral (DVT)  Result Date: 06/29/2023 CLINICAL DATA:  Recurrent PE; chest pain; EXAM:  Bilateral LOWER EXTREMITY VENOUS DOPPLER ULTRASOUND TECHNIQUE: Gray-scale sonography with compression, as well as color and duplex ultrasound, were performed to evaluate the deep venous system(s) from the level of the common femoral vein through the popliteal and proximal calf veins. COMPARISON:  Ultrasound 08/21/2022 and 06/17/2020 FINDINGS: VENOUS Normal compressibility of the common femoral, superficial femoral, and popliteal veins, as well as the visualized calf veins. Visualized portions of profunda femoral vein and great saphenous vein unremarkable. No filling defects to suggest DVT on grayscale or color Doppler imaging. Doppler waveforms show normal direction of venous flow, normal respiratory plasticity and response to augmentation. Limited views of the contralateral common femoral vein are unremarkable. OTHER None. Limitations: none IMPRESSION: Negative for acute DVT. Electronically Signed   By: Minerva Fester M.D.   On: 06/29/2023 03:14   CT Angio Chest PE W and/or Wo Contrast  Result Date: 06/28/2023 CLINICAL DATA:  Pulmonary embolism (PE) suspected, high prob. Right shoulder, arm and back pain. Chest pain. History of blood clots. EXAM: CT ANGIOGRAPHY CHEST WITH CONTRAST TECHNIQUE: Multidetector CT imaging of the chest was performed using the standard protocol during bolus administration of intravenous contrast. Multiplanar CT image reconstructions and MIPs were obtained to evaluate the vascular anatomy. RADIATION DOSE REDUCTION: This exam was performed according to the departmental dose-optimization program which includes automated exposure control, adjustment of the mA and/or kV according to patient size and/or use of iterative reconstruction technique. CONTRAST:  75mL OMNIPAQUE IOHEXOL 350 MG/ML SOLN COMPARISON:  08/21/2022 FINDINGS: Cardiovascular: Heart is normal size. Aorta is normal caliber. Segmental filling defect within posterior right lower lobe compatible with pulmonary embolus. No  additional pulmonary emboli. No evidence of right heart strain. Heart is normal size. Aorta normal caliber. Mediastinum/Nodes: No mediastinal, hilar, or axillary adenopathy. Trachea and esophagus are unremarkable. Thyroid unremarkable. Lungs/Pleura: No confluent opacities or effusions. Linear bibasilar atelectasis. Upper Abdomen: No acute findings Musculoskeletal: Chest wall soft  tissues are unremarkable. No acute bony abnormality. Review of the MIP images confirms the above findings. IMPRESSION: Right lower lobe pulmonary embolus. No evidence of right heart strain. Bibasilar atelectasis. These results were called by telephone at the time of interpretation on 06/28/2023 at 10:39 pm to provider [facility] , who verbally acknowledged these results. Electronically Signed   By: Charlett Nose M.D.   On: 06/28/2023 22:40   DG Shoulder Right  Result Date: 06/28/2023 CLINICAL DATA:  Shoulder pain.  Radiates to the back. EXAM: RIGHT SHOULDER - 2+ VIEW COMPARISON:  None Available. FINDINGS: There is no evidence of fracture or dislocation. There is no evidence of glenohumeral arthropathy or other focal bone abnormality. There is mild circumferential spurring at the [facility] joint with joint narrowing. Soft tissues are unremarkable. IMPRESSION: Mild degenerative changes at the [facility] joint. Evidence of fractures is not seen. Electronically Signed   By: Almira Bar M.D.   On: 06/28/2023 22:21   DG Chest 2 View  Result Date: 06/28/2023 CLINICAL DATA:  Hypertension chest pain EXAM: CHEST - 2 VIEW COMPARISON:  11/07/2021 FINDINGS: The heart size and mediastinal contours are within normal limits. Both lungs are clear. The visualized skeletal structures are unremarkable. IMPRESSION: No active cardiopulmonary disease. Electronically Signed   By: Jasmine Pang M.D.   On: 06/28/2023 21:24             LOS: 0 days      Sunnie Nielsen, DO [facility] 06/29/2023, 4:33 PM    Dictation software may have  been used to generate the above note. Typos may occur and escape review in typed/dictated notes. Please contact Dr Lyn Hollingshead directly for clarity if needed.  Staff may message me via secure chat in Epic  but this may not receive an immediate response,  please page me for urgent matters!  If 7PM-7AM, please contact night coverage [email or web address]

## 2023-06-29 NOTE — Hospital Course (Addendum)
Benjamin Delacruz is a 57 y.o. male with medical history significant of DVT and PE on Xarelto but has not been taking this past 2 days, HTN, HLD, CAD with MNII, OSA not using CPAP, obesity, who presents with x2 days sharp chest pain aggravated by deep breaths, and x1 week of right shoulder pain. Assoc w/ SOB and lightheaded. Patient has asymmetric leg edema, right leg is worse than the left.  08/22: to ED, admitted for RLL PE w/o right heart strain. Also AKI. Echo ordered, Bilateral LE [place] eval DVT, heparin gtt, consideration for switch to Eliquis since only out of Xarelto x2 days and symptoms have been ongoing longer than that.  08/23: Echo no concerns. [place] negative for DVT. AKI resolved. Plan was for discharge but on ambulation pt became tachcyardic and lightheaded, will keep overnight on telemetry  08/24: no concerns overnight or this morning, no significant events on telemetry, stable for discharge   Consultants:  none  Procedures: none      ASSESSMENT & PLAN:   Principal Problem:   Recurrent pulmonary emboli (HCC) Active Problems:   History of deep venous thrombosis (DVT) of distal vein of right lower extremity   Pure hypercholesterolemia   Essential hypertension   AKI (acute kidney injury) (HCC)   Obesity (BMI 30-39.9)   Recurrent pulmonary emboli (HCC):  Off heparin gtt Switched Xarelto to Eliquis  Hypercoag panel / may need hematology f/u    History of deep venous thrombosis (DVT) of distal vein of right lower extremity Ruled out current DVT  Eliquis   Pure hypercholesterolemia Lipitor   Essential hypertension hold HCTZ and Lasix due to AKI, can probably resume next 1-2 weeks, given dizziness.orthostatic symptoms will hold for now / lasix prn  Continue Coreg IV hydralazine as needed   AKI (acute kidney injury) (HCC) - resolved IV fluid to continue for now given lightheadeness  Hold HCTZ and Lasix   Obesity (BMI 30-39.9): Body weight 112.5 kg, BMI 38.84 Exercise and  healthy diet Encourage losing weight     DVT prophylaxis: Eliquis  Code Status: FULL CODE ACP documentation reviewed: none on file   Current Admission Status: obs --> inpatient  TOC needs / Dispo plan: expect home when stable  Barriers to discharge / significant pending items: leaving on telemetry tonight, will trial ambulation in AM, if only minimal tachycardia would be ok for discharge assuming no other complications overnight

## 2023-06-29 NOTE — Progress Notes (Signed)
ANTICOAGULATION CONSULT NOTE  Pharmacy Consult for heparin infusion Indication: pulmonary embolus  No Known Allergies  Patient Measurements: Height: 5\' 7"  (170.2 cm) Weight: 112.5 kg (248 lb) IBW/kg (Calculated) : 66.1 Heparin Dosing Weight: 91.6 kg  Vital Signs: Temp: 98.2 F (36.8 C) (08/22 2230) Temp Source: Oral (08/22 2010) BP: 142/92 (08/22 2300) Pulse Rate: 71 (08/22 2300)  Labs: Recent Labs    06/28/23 2017 06/28/23 2205 06/29/23 0017  HGB 14.1  --   --   HCT 41.4  --   --   PLT 196  --   --   APTT  --   --  25  LABPROT  --   --  13.6  INR  --   --  1.0  HEPARINUNFRC  --   --  <0.10*  CREATININE 1.44*  --   --   TROPONINIHS 4 4  --     Estimated Creatinine Clearance: 66.2 mL/min (A) (by C-G formula based on SCr of 1.44 mg/dL (H)).   Medical History: Past Medical History:  Diagnosis Date   DVT (deep venous thrombosis) (HCC)    Hypercholesteremia    MI (myocardial infarction) (HCC)    Pulmonary embolism (HCC)    Sleep apnea     Medications:  PTA Meds: Xarelto 20 mg daily, last dose unknown  Assessment: Pt is a 57 yo male with history of multiple DVTs, PE, CAD with prior MI, hypertension, and hyperlipidemia who presents to ED with right shoulder and chest pain and found w/ "right lower lobe pulmonary embolus."   Goal of Therapy:  Heparin level 0.3-0.7 units/ml aPTT 66-102 seconds Monitor platelets by anticoagulation protocol: Yes   08/23 0017 Baseline HL < 0.10  Plan:  Continue heparin infusion at 1500 units/hr Will check HL in 6 hr after start of infusion CBC daily while on heparin  Otelia Sergeant, PharmD, [facility] 06/29/2023 1:48 AM

## 2023-06-29 NOTE — Progress Notes (Signed)
ANTICOAGULATION CONSULT NOTE  Pharmacy Consult for heparin infusion Indication: pulmonary embolus  No Known Allergies  Patient Measurements: Height: 5\' 7"  (170.2 cm) Weight: 112.5 kg (248 lb) IBW/kg (Calculated) : 66.1 Heparin Dosing Weight: 91.6 kg  Vital Signs: Temp: 97.6 F (36.4 C) (08/23 0744) Temp Source: Oral (08/23 0744) BP: 111/83 (08/23 0800) Pulse Rate: 62 (08/23 0800)  Labs: Recent Labs    06/28/23 2017 06/28/23 2205 06/29/23 0017 06/29/23 0554 06/29/23 0743  HGB 14.1  --   --  12.9*  --   HCT 41.4  --   --  39.5  --   PLT 196  --   --  165  --   APTT  --   --  25  --   --   LABPROT  --   --  13.6  --   --   INR  --   --  1.0  --   --   HEPARINUNFRC  --   --  <0.10*  --  0.67  CREATININE 1.44*  --   --  1.29*  --   TROPONINIHS 4 4  --   --   --     Estimated Creatinine Clearance: 73.9 mL/min (A) (by C-G formula based on SCr of 1.29 mg/dL (H)).   Medical History: Past Medical History:  Diagnosis Date   DVT (deep venous thrombosis) (HCC)    Hypercholesteremia    MI (myocardial infarction) (HCC)    Pulmonary embolism (HCC)    Sleep apnea     Medications:  PTA Meds: Xarelto 20 mg daily, last dose unknown  Assessment: Pt is a 57 yo male with history of multiple DVTs, PE, CAD with prior MI, hypertension, and hyperlipidemia who presents to ED with right shoulder and chest pain and found w/ "right lower lobe pulmonary embolus."   Goal of Therapy:  Heparin level 0.3-0.7 units/ml aPTT 66-102 seconds Monitor platelets by anticoagulation protocol: Yes   08/23 0017 Baseline HL < 0.10 08/23 0743 HL 0.67, therapeutic x 1  Plan:  Continue heparin infusion at 1500 units/hr Recheck HL in 6 hr to confirm CBC daily while on heparin  Barrie Folk, PharmD 06/29/2023 10:33 AM

## 2023-06-29 NOTE — TOC Benefit Eligibility Note (Signed)
Patient Product/process development scientist completed.    The patient is insured through [employer]. Patient has Medicare and is not eligible for a copay card, but may be able to apply for patient assistance, if available.    Ran test claim for Eliquis 5 mg and the current 30 day co-pay is $45.00.   This test claim was processed through [facility]- copay amounts may vary at other pharmacies due to pharmacy/plan contracts, or as the patient moves through the different stages of their insurance plan.     Roland Earl, CPHT Pharmacy Technician III Certified Patient Advocate [facility] Patient Advocate Team Direct Number: [phone]  Fax: [phone]

## 2023-06-29 NOTE — Progress Notes (Signed)
*  PRELIMINARY RESULTS* Echocardiogram 2D Echocardiogram has been performed.  Benjamin Delacruz 06/29/2023, 2:27 PM

## 2023-06-29 NOTE — Progress Notes (Signed)
Mobility Specialist - Progress Note   06/29/23 1600  Mobility  Activity Ambulated with assistance in hallway  Level of Assistance Standby assist, set-up cues, supervision of patient - no hands on  Assistive Device Front wheel walker  Distance Ambulated (ft) 35 ft  $Mobility charge 1 Mobility     Pre-mobility: 55 HR, 99% SpO2 During mobility: 67-138 HR, 91% SpO2 Post-mobility: 68 HR, 100% SpO2   Pt lying in bed upon arrival, utilizing RA. Pt agreeable to activity. Completed bed mobility independently. Voiced dizziness/lightheadedness upon sitting. STS x3 with minG; pt with increased lightheadedness upon standing 9/10 with 2 occurrences of mild posterior stumbling. BP taken while EOB---146/90 (108). Pt ambulated short distance in hallway with minG. RW given to pt with no LOB noted during ambulation. HR seemingly jumped up to 130s during ambulation and returned to high 60s end of session. Pt returned to bed with alarm set, needs in reach. RN notified.    Filiberto Pinks Mobility Specialist 06/29/23, 4:29 PM

## 2023-06-30 DIAGNOSIS — I2699 Other pulmonary embolism without acute cor pulmonale: Secondary | ICD-10-CM | POA: Diagnosis not present

## 2023-06-30 LAB — CBC
HCT: 36.1 % — ABNORMAL LOW (ref 39.0–52.0)
Hemoglobin: 12.1 g/dL — ABNORMAL LOW (ref 13.0–17.0)
MCH: 29.7 pg (ref 26.0–34.0)
MCHC: 33.5 g/dL (ref 30.0–36.0)
MCV: 88.7 fL (ref 80.0–100.0)
Platelets: 159 10*3/uL (ref 150–400)
RBC: 4.07 MIL/uL — ABNORMAL LOW (ref 4.22–5.81)
RDW: 13.2 % (ref 11.5–15.5)
WBC: 6.8 10*3/uL (ref 4.0–10.5)
nRBC: 0 % (ref 0.0–0.2)

## 2023-06-30 LAB — PROTEIN C ACTIVITY: Protein C Activity: 114 % (ref 73–180)

## 2023-06-30 LAB — PROTEIN S, TOTAL: Protein S Ag, Total: 94 % (ref 60–150)

## 2023-06-30 LAB — BETA-2-GLYCOPROTEIN I ABS, IGG/M/A
Beta-2 Glyco I IgG: <9 GPI IgG units (ref 0–20)
Beta-2-Glycoprotein I IgA: <9 GPI IgA units (ref 0–25)
Beta-2-Glycoprotein I IgM: <9 GPI IgM units (ref 0–32)

## 2023-06-30 LAB — LUPUS ANTICOAGULANT PANEL
DRVVT: 35.9 s (ref 0.0–47.0)
PTT Lupus Anticoagulant: 28.5 s (ref 0.0–43.5)

## 2023-06-30 LAB — CARDIOLIPIN ANTIBODIES, IGG, IGM, IGA
Anticardiolipin IgA: <9 U/mL (ref 0–11)
Anticardiolipin IgG: <9 GPL U/mL (ref 0–14)
Anticardiolipin IgM: <9 MPL U/mL (ref 0–12)

## 2023-06-30 LAB — HOMOCYSTEINE: Homocysteine: 10.1 umol/L (ref 0.0–14.5)

## 2023-06-30 LAB — PROTEIN S ACTIVITY: Protein S Activity: 84 % (ref 63–140)

## 2023-06-30 MED ORDER — DICLOFENAC SODIUM 1 % EX GEL: 4.0000 g | Freq: Four times a day (QID) | CUTANEOUS | 0 refills | Status: AC

## 2023-06-30 MED ORDER — CARVEDILOL 6.25 MG PO TABS
3.1250 mg | ORAL_TABLET | Freq: Two times a day (BID) | ORAL | Status: AC
Start: 2023-06-30 — End: ?

## 2023-06-30 MED ORDER — CARVEDILOL 6.25 MG PO TABS
3.1250 mg | ORAL_TABLET | Freq: Two times a day (BID) | ORAL | Status: DC
  Administered 2023-06-30: 3.125 mg via ORAL
  Filled 2023-06-30: qty 1

## 2023-06-30 MED ORDER — OXYCODONE HCL 5 MG PO TABS: 2.5000 mg | ORAL_TABLET | Freq: Four times a day (QID) | ORAL | 0 refills | Status: AC | PRN

## 2023-06-30 MED ORDER — FUROSEMIDE 20 MG PO TABS: ORAL_TABLET | ORAL | Status: DC

## 2023-06-30 NOTE — Progress Notes (Signed)
Benjamin Delacruz to be discharged Home per MD order. Discussed prescriptions and follow up appointments with the patient. Prescriptions given to patient, medication list explained in detail. Patient verbalized understanding.  Allergies as of 06/30/2023   No Known Allergies      Medication List     STOP taking these medications    hydrochlorothiazide 25 MG tablet Commonly known as: HYDRODIURIL   terbinafine 250 MG tablet Commonly known as: LAMISIL   Xarelto 20 MG Tabs tablet Generic drug: rivaroxaban       TAKE these medications    apixaban 5 MG Tabs tablet Commonly known as: ELIQUIS Take 2 tablets (10 mg total) by mouth 2 (two) times daily for 7 days, THEN 1 tablet (5 mg total) 2 (two) times daily. Start taking on: June 29, 2023   atorvastatin 20 MG tablet Commonly known as: LIPITOR Take 1 tablet (20 mg total) by mouth at bedtime.   carvedilol 6.25 MG tablet Commonly known as: COREG Take 0.5 tablets (3.125 mg total) by mouth 2 (two) times daily. What changed: how much to take   diclofenac Sodium 1 % Gel Commonly known as: VOLTAREN Apply 4 g topically 4 (four) times daily. To affected joint as needed for pain   furosemide 20 MG tablet Commonly known as: LASIX Take 1 tablet (20 mg total) by mouth ONCE OR TWICE daily (total daily dose maximum 40 mg) as needed for up to 3 days for increased leg swelling, weight gain 5+ lbs over 1-2 days. Seek medical care if these symptoms are not improving with increased dose. What changed:  how much to take how to take this when to take this reasons to take this additional instructions   ipratropium 0.06 % nasal spray Commonly known as: ATROVENT Place 2 sprays into both nostrils 4 (four) times daily as needed for rhinitis.   oxyCODONE 5 MG immediate release tablet Commonly known as: Roxicodone Take 0.5-1 tablets (2.5-5 mg total) by mouth every 6 (six) hours as needed for up to 5 days for severe pain.   pantoprazole 40 MG  tablet Commonly known as: PROTONIX Take 1 tablet (40 mg total) by mouth daily before breakfast.        Vitals:   06/30/23 0813 06/30/23 1023  BP: (!) 143/86   Pulse: 64 82  Resp: 18   Temp: 98.4 F (36.9 C)   SpO2: 98% 98%    Skin clean, dry and intact without evidence of skin break down and or skin tears. IV catheter discontinued intact. Site without signs and symptoms of complications. Dressing and pressure applied. Patient denies pain at this time. No complaints noted.  An After Visit Summary was printed and given to the patient. Patient escorted via wheelchair and discharged Home home via private auto.  Madie Reno, RN

## 2023-06-30 NOTE — Plan of Care (Signed)
Patient is adequate for discharge. Resolving plan of care.  Benjamin Delacruz  

## 2023-06-30 NOTE — Progress Notes (Signed)
Ambulated with patient around unit. Patient maintained HR 81-84 and SpO2 97-99 on RA. Patient denied SOB, HA, and or dizziness while ambulating or at rest. Patient did state he is still having right sharp shoulder pain which is what initially brought him to the ER. Per patient pain is similar to pain related to previous blood clots. Pain is currently 8/10. MD updated.   Madie Reno, RN

## 2023-06-30 NOTE — Plan of Care (Signed)

## 2023-06-30 NOTE — Plan of Care (Signed)
  Problem: Clinical Measurements: Goal: Respiratory complications will improve Outcome: Progressing Goal: Cardiovascular complication will be avoided Outcome: Progressing   Problem: Activity: Goal: Risk for activity intolerance will decrease Outcome: Progressing   Problem: Pain Managment: Goal: General experience of comfort will improve Outcome: Not Progressing   

## 2023-06-30 NOTE — Discharge Summary (Signed)
Physician Discharge Summary   Patient: Benjamin Delacruz MRN: [id number]  DOB: August 20, 1966   Admit:     Date of Admission: 06/28/2023 Admitted from: home   Discharge: Date of discharge: 06/30/23 Disposition: Home Condition at discharge: good  CODE STATUS: FULL CODE     Discharge Physician: Sunnie Nielsen, DO [facility]     PCP: Smitty Cords, DO  Recommendations for Outpatient Follow-up:  Follow up with PCP Smitty Cords, DO in 1-2 weeks Please obtain labs/tests: CBC, BMP, BP recheck  in 1-2 weeks Will need refills on eliquis  May need hematology f/u  May need referral to orthopedics if persistent shoulder pain  Please follow up on the following pending results: hypercoagulation panel     Discharge Instructions     Diet - low sodium heart healthy   Complete by: As directed    Increase activity slowly   Complete by: As directed          Discharge Diagnoses: Principal Problem:   Recurrent pulmonary emboli (HCC) Active Problems:   History of deep venous thrombosis (DVT) of distal vein of right lower extremity   Pure hypercholesterolemia   Essential hypertension   AKI (acute kidney injury) (HCC)   Obesity (BMI 30-39.9)       Hospital Course: Benjamin Delacruz is a 57 y.o. male with medical history significant of DVT and PE on Xarelto but has not been taking this past 2 days, HTN, HLD, CAD with MNII, OSA not using CPAP, obesity, who presents with x2 days sharp chest pain aggravated by deep breaths, and x1 week of right shoulder pain. Assoc w/ SOB and lightheaded. Patient has asymmetric leg edema, right leg is worse than the left.  08/22: to ED, admitted for RLL PE w/o right heart strain. Also AKI. Echo ordered, Bilateral LE [place] eval DVT, heparin gtt, consideration for switch to Eliquis since only out of Xarelto x2 days and symptoms have been ongoing longer than that.  08/23: Echo no concerns. [place] negative for DVT. AKI resolved. Plan was  for discharge but on ambulation pt became tachcyardic and lightheaded, will keep overnight on telemetry  08/24: no concerns overnight or this morning, no significant events on telemetry, stable for discharge   Consultants:  none  Procedures: none      ASSESSMENT & PLAN:   Recurrent pulmonary emboli (HCC):  Off heparin gtt Switched Xarelto to Eliquis  Hypercoag panel / may need hematology f/u    History of deep venous thrombosis (DVT) of distal vein of right lower extremity Ruled out current DVT  Eliquis   Pure hypercholesterolemia Lipitor   Essential hypertension hold HCTZ and Lasix due to AKI, can probably resume next 1-2 weeks, given dizziness.orthostatic symptoms will hold for now / lasix prn  Continue Coreg IV hydralazine as needed   AKI (acute kidney injury) (HCC) - resolved IV fluid to continue for now given lightheadeness  Hold HCTZ and Lasix   Obesity (BMI 30-39.9): Body weight 112.5 kg, BMI 38.84 Exercise and healthy diet Encourage losing weight           Discharge Instructions  Allergies as of 06/30/2023   No Known Allergies      Medication List     STOP taking these medications    hydrochlorothiazide 25 MG tablet Commonly known as: HYDRODIURIL   terbinafine 250 MG tablet Commonly known as: LAMISIL   Xarelto 20 MG Tabs tablet Generic drug: rivaroxaban       TAKE these  medications    apixaban 5 MG Tabs tablet Commonly known as: ELIQUIS Take 2 tablets (10 mg total) by mouth 2 (two) times daily for 7 days, THEN 1 tablet (5 mg total) 2 (two) times daily. Start taking on: June 29, 2023   atorvastatin 20 MG tablet Commonly known as: LIPITOR Take 1 tablet (20 mg total) by mouth at bedtime.   carvedilol 6.25 MG tablet Commonly known as: COREG Take 0.5 tablets (3.125 mg total) by mouth 2 (two) times daily. What changed: how much to take   furosemide 20 MG tablet Commonly known as: LASIX Take 1 tablet (20 mg total) by mouth  ONCE OR TWICE daily (total daily dose maximum 40 mg) as needed for up to 3 days for increased leg swelling, weight gain 5+ lbs over 1-2 days. Seek medical care if these symptoms are not improving with increased dose. What changed:  how much to take how to take this when to take this reasons to take this additional instructions   ipratropium 0.06 % nasal spray Commonly known as: ATROVENT Place 2 sprays into both nostrils 4 (four) times daily as needed for rhinitis.   pantoprazole 40 MG tablet Commonly known as: PROTONIX Take 1 tablet (40 mg total) by mouth daily before breakfast.          No Known Allergies   Subjective: pt feeling wlel this morning except should pain is still bothering him some, no SOB, ambulating well w/o dizziness 3   Discharge Exam: BP (!) 143/86 (BP Location: Left Arm)   Pulse 82   Temp 98.4 F (36.9 C) (Oral)   Resp 18   Ht 5\' 7"  (1.702 m)   Wt 112.5 kg   SpO2 98%   BMI 38.84 kg/m  General: Pt is alert, awake, not in acute distress Cardiovascular: RRR, S1/S2 +, no rubs, no gallops Respiratory: CTA bilaterally, no wheezing, no rhonchi Abdominal: Soft, NT, ND, bowel sounds + Extremities: no edema, no cyanosis     The results of significant diagnostics from this hospitalization (including imaging, microbiology, ancillary and laboratory) are listed below for reference.     Microbiology: Recent Results (from the past 240 hour(s))  SARS Coronavirus 2 by RT PCR (hospital order, performed in [facility] hospital lab) *cepheid single result test* Anterior Nasal Swab     Status: None   Collection Time: 06/28/23  9:41 PM   Specimen: Anterior Nasal Swab  Result Value Ref Range Status   SARS Coronavirus 2 by RT PCR NEGATIVE NEGATIVE Final    Comment: (NOTE) SARS-CoV-2 target nucleic acids are NOT DETECTED.  The SARS-CoV-2 RNA is generally detectable in upper and lower respiratory specimens during the acute phase of infection. The  lowest concentration of SARS-CoV-2 viral copies this assay can detect is 250 copies / mL. A negative result does not preclude SARS-CoV-2 infection and should not be used as the sole basis for treatment or other patient management decisions.  A negative result may occur with improper specimen collection / handling, submission of specimen other than nasopharyngeal swab, presence of viral mutation(s) within the areas targeted by this assay, and inadequate number of viral copies (<250 copies / mL). A negative result must be combined with clinical observations, patient history, and epidemiological information.  Fact Sheet for Patients:   RoadLapTop.co.za  Fact Sheet for Healthcare Providers: http://kim-miller.com/  This test is not yet approved or  cleared by the [place] FDA and has been authorized for detection and/or diagnosis of SARS-CoV-2 by FDA  under an Emergency Use Authorization (EUA).  This EUA will remain in effect (meaning this test can be used) for the duration of the COVID-19 declaration under Section 564(b)(1) of the Act, 21 U.S.C. section 360bbb-3(b)(1), unless the authorization is terminated or revoked sooner.  Performed at [facility], [address] [place], [place] [address]      Labs: BNP (last 3 results) No results for input(s): "BNP" in the last 8760 hours. Basic Metabolic Panel: Recent Labs  Lab 06/28/23 2017 06/29/23 0554  NA 139 143  K 3.8 4.3  CL 105 110  CO2 24 24  GLUCOSE 88 115*  BUN 13 13  CREATININE 1.44* 1.29*  CALCIUM 9.3 9.0   Liver Function Tests: Recent Labs  Lab 06/28/23 2017  AST 23  ALT 13  ALKPHOS 87  BILITOT 1.3*  PROT 7.6  ALBUMIN 4.4   No results for input(s): "LIPASE", "AMYLASE" in the last 168 hours. No results for input(s): "AMMONIA" in the last 168 hours. CBC: Recent Labs  Lab 06/28/23 2017 06/29/23 0554 06/30/23 0417  WBC 9.1 7.7 6.8  HGB 14.1  12.9* 12.1*  HCT 41.4 39.5 36.1*  MCV 86.3 89.8 88.7  PLT 196 165 159   Cardiac Enzymes: No results for input(s): "CKTOTAL", "CKMB", "CKMBINDEX", "TROPONINI" in the last 168 hours. BNP: Invalid input(s): "POCBNP" CBG: No results for input(s): "GLUCAP" in the last 168 hours. D-Dimer No results for input(s): "DDIMER" in the last 72 hours. Hgb A1c No results for input(s): "HGBA1C" in the last 72 hours. Lipid Profile No results for input(s): "CHOL", "HDL", "LDLCALC", "TRIG", "CHOLHDL", "LDLDIRECT" in the last 72 hours. Thyroid function studies No results for input(s): "TSH", "T4TOTAL", "T3FREE", "THYROIDAB" in the last 72 hours.  Invalid input(s): "FREET3" Anemia work up No results for input(s): "VITAMINB12", "FOLATE", "FERRITIN", "TIBC", "IRON", "RETICCTPCT" in the last 72 hours. Urinalysis    Component Value Date/Time   COLORURINE YELLOW (A) 02/16/2020 0825   APPEARANCEUR CLEAR (A) 02/16/2020 0825   LABSPEC 1.016 02/16/2020 0825   PHURINE 5.0 02/16/2020 0825   GLUCOSEU NEGATIVE 02/16/2020 0825   HGBUR NEGATIVE 02/16/2020 0825   BILIRUBINUR NEGATIVE 02/16/2020 0825   KETONESUR NEGATIVE 02/16/2020 0825   PROTEINUR NEGATIVE 02/16/2020 0825   NITRITE NEGATIVE 02/16/2020 0825   LEUKOCYTESUR NEGATIVE 02/16/2020 0825   Sepsis Labs Recent Labs  Lab 06/28/23 2017 06/29/23 0554 06/30/23 0417  WBC 9.1 7.7 6.8   Microbiology Recent Results (from the past 240 hour(s))  SARS Coronavirus 2 by RT PCR (hospital order, performed in [facility] hospital lab) *cepheid single result test* Anterior Nasal Swab     Status: None   Collection Time: 06/28/23  9:41 PM   Specimen: Anterior Nasal Swab  Result Value Ref Range Status   SARS Coronavirus 2 by RT PCR NEGATIVE NEGATIVE Final    Comment: (NOTE) SARS-CoV-2 target nucleic acids are NOT DETECTED.  The SARS-CoV-2 RNA is generally detectable in upper and lower respiratory specimens during the acute phase of infection. The  lowest concentration of SARS-CoV-2 viral copies this assay can detect is 250 copies / mL. A negative result does not preclude SARS-CoV-2 infection and should not be used as the sole basis for treatment or other patient management decisions.  A negative result may occur with improper specimen collection / handling, submission of specimen other than nasopharyngeal swab, presence of viral mutation(s) within the areas targeted by this assay, and inadequate number of viral copies (<250 copies / mL). A negative result must be combined  with clinical observations, patient history, and epidemiological information.  Fact Sheet for Patients:   RoadLapTop.co.za  Fact Sheet for Healthcare Providers: http://kim-miller.com/  This test is not yet approved or  cleared by the [place] FDA and has been authorized for detection and/or diagnosis of SARS-CoV-2 by FDA under an Emergency Use Authorization (EUA).  This EUA will remain in effect (meaning this test can be used) for the duration of the COVID-19 declaration under Section 564(b)(1) of the Act, 21 U.S.C. section 360bbb-3(b)(1), unless the authorization is terminated or revoked sooner.  Performed at [facility], [address] [place], [place] [address]    Imaging ECHOCARDIOGRAM COMPLETE  Result Date: 06/29/2023    ECHOCARDIOGRAM REPORT   Patient Name:   SRIJAN BRUNGARDT Date of Exam: 06/29/2023 Medical Rec #:  [id number]    Height:       67.0 in Accession #:    [id number]   Weight:       248.0 lb Date of Birth:  1966-04-02     BSA:          2.216 m Patient Age:    57 years     BP:           109/82 mmHg Patient Gender: M            HR:           56 bpm. Exam Location:  [facility] Procedure: 2D Echo, Cardiac Doppler and Color Doppler Indications:     Pulmonary embolus I26.09  History:         Patient has no prior history of Echocardiogram examinations.                  Previous Myocardial Infarction;  Risk Factors:Sleep Apnea. DVT,                  pulmonary embolus.  Sonographer:     Cristela Blue Referring Phys:  [id number] Sunnie Nielsen Diagnosing Phys: Lorine Bears MD  Sonographer Comments: Suboptimal apical window. IMPRESSIONS  1. Left ventricular ejection fraction, by estimation, is 60 to 65%. The left ventricle has normal function. The left ventricle has no regional wall motion abnormalities. Left ventricular diastolic parameters were normal.  2. Right ventricular systolic function is normal. The right ventricular size is normal. There is normal pulmonary artery systolic pressure.  3. The mitral valve is normal in structure. No evidence of mitral valve regurgitation. No evidence of mitral stenosis.  4. The aortic valve is normal in structure. Aortic valve regurgitation is not visualized. No aortic stenosis is present.  5. Aortic dilatation noted. There is mild dilatation of the aortic root, measuring 39 mm. FINDINGS  Left Ventricle: Left ventricular ejection fraction, by estimation, is 60 to 65%. The left ventricle has normal function. The left ventricle has no regional wall motion abnormalities. The left ventricular internal cavity size was normal in size. There is  borderline left ventricular hypertrophy. Left ventricular diastolic parameters were normal. Right Ventricle: The right ventricular size is normal. No increase in right ventricular wall thickness. Right ventricular systolic function is normal. There is normal pulmonary artery systolic pressure. The tricuspid regurgitant velocity is 2.25 m/s, and  with an assumed right atrial pressure of 5 mmHg, the estimated right ventricular systolic pressure is 25.2 mmHg. Left Atrium: Left atrial size was normal in size. Right Atrium: Right atrial size was normal in size. Pericardium: There is no evidence of pericardial effusion. Mitral Valve: The mitral valve is normal in structure.  No evidence of mitral valve regurgitation. No evidence of mitral valve  stenosis. MV peak gradient, 4.2 mmHg. The mean mitral valve gradient is 2.0 mmHg. Tricuspid Valve: The tricuspid valve is normal in structure. Tricuspid valve regurgitation is trivial. No evidence of tricuspid stenosis. Aortic Valve: The aortic valve is normal in structure. Aortic valve regurgitation is not visualized. No aortic stenosis is present. Aortic valve mean gradient measures 3.0 mmHg. Aortic valve peak gradient measures 5.7 mmHg. Aortic valve area, by VTI measures 3.75 cm. Pulmonic Valve: The pulmonic valve was normal in structure. Pulmonic valve regurgitation is not visualized. No evidence of pulmonic stenosis. Aorta: Aortic dilatation noted. There is mild dilatation of the aortic root, measuring 39 mm. Venous: The inferior vena cava was not well visualized. IAS/Shunts: No atrial level shunt detected by color flow Doppler.  LEFT VENTRICLE PLAX 2D LVIDd:         4.30 cm   Diastology LVIDs:         2.70 cm   LV e' medial:    6.31 cm/s LV PW:         1.20 cm   LV E/e' medial:  12.7 LV IVS:        1.00 cm   LV e' lateral:   9.36 cm/s LVOT diam:     2.20 cm   LV E/e' lateral: 8.6 LV SV:         84 LV SV Index:   38 LVOT Area:     3.80 cm  RIGHT VENTRICLE RV Basal diam:  3.30 cm RV Mid diam:    3.60 cm RV S prime:     11.30 cm/s TAPSE (M-mode): 2.6 cm LEFT ATRIUM             Index        RIGHT ATRIUM           Index LA diam:        3.60 cm 1.62 cm/m   RA Area:     15.10 cm LA Vol (A2C):   36.1 ml 16.29 ml/m  RA Volume:   36.70 ml  16.56 ml/m LA Vol (A4C):   29.5 ml 13.31 ml/m LA Biplane Vol: 34.4 ml 15.52 ml/m  AORTIC VALVE AV Area (Vmax):    3.35 cm AV Area (Vmean):   3.73 cm AV Area (VTI):     3.75 cm AV Vmax:           119.00 cm/s AV Vmean:          71.700 cm/s AV VTI:            0.223 m AV Peak Grad:      5.7 mmHg AV Mean Grad:      3.0 mmHg LVOT Vmax:         105.00 cm/s LVOT Vmean:        70.400 cm/s LVOT VTI:          0.220 m LVOT/AV VTI ratio: 0.99  AORTA Ao Root diam: 3.70 cm MITRAL VALVE                TRICUSPID VALVE MV Area (PHT): 4.01 cm    TR Peak grad:   20.2 mmHg MV Area VTI:   2.85 cm    TR Vmax:        225.00 cm/s MV Peak grad:  4.2 mmHg MV Mean grad:  2.0 mmHg    SHUNTS MV Vmax:       1.03 m/s  Systemic VTI:  0.22 m MV Vmean:      60.9 cm/s   Systemic Diam: 2.20 cm MV Decel Time: 189 msec MV E velocity: 80.20 cm/s MV A velocity: 57.60 cm/s MV E/A ratio:  1.39 Lorine Bears MD Electronically signed by Lorine Bears MD Signature Date/Time: 06/29/2023/3:19:15 PM    Final    US Venous Img Lower Bilateral (DVT)  Result Date: 06/29/2023 CLINICAL DATA:  Recurrent PE; chest pain; EXAM: Bilateral LOWER EXTREMITY VENOUS DOPPLER ULTRASOUND TECHNIQUE: Gray-scale sonography with compression, as well as color and duplex ultrasound, were performed to evaluate the deep venous system(s) from the level of the common femoral vein through the popliteal and proximal calf veins. COMPARISON:  Ultrasound 08/21/2022 and 06/17/2020 FINDINGS: VENOUS Normal compressibility of the common femoral, superficial femoral, and popliteal veins, as well as the visualized calf veins. Visualized portions of profunda femoral vein and great saphenous vein unremarkable. No filling defects to suggest DVT on grayscale or color Doppler imaging. Doppler waveforms show normal direction of venous flow, normal respiratory plasticity and response to augmentation. Limited views of the contralateral common femoral vein are unremarkable. OTHER None. Limitations: none IMPRESSION: Negative for acute DVT. Electronically Signed   By: Minerva Fester M.D.   On: 06/29/2023 03:14      Time coordinating discharge: over 30 minutes  SIGNED:  Sunnie Nielsen DO [facility]

## 2023-07-01 LAB — PROTEIN C, TOTAL: Protein C, Total: 87 % (ref 60–150)

## 2023-07-02 ENCOUNTER — Telehealth: Payer: Self-pay

## 2023-07-02 NOTE — Transitions of Care (Post Inpatient/ED Visit) (Signed)
   07/02/2023  Name: Benjamin Delacruz MRN: [id number] DOB: [birth year]  Today's TOC FU Call Status: Today's TOC FU Call Status:: Successful TOC FU Call Completed TOC FU Call Complete Date: 07/02/23 Patient's Name and Date of Birth confirmed.  Transition Care Management Follow-up Telephone Call Date of Discharge: 06/30/23 Discharge Facility: [facility]) Type of Discharge: Inpatient Admission Primary Inpatient Discharge Diagnosis:: Recurrent pulmonary emboli How have you been since you were released from the hospital?: Better Any questions or concerns?: Yes Patient Questions/Concerns:: (S) Eliquis was $200- pt cant afford and didnt pick up from pharmacy - not had any blood thinners since D/C from hospital Patient Questions/Concerns Addressed: (S) Notified Provider of Patient Questions/Concerns  Items Reviewed: Did you receive and understand the discharge instructions provided?: Yes Medications obtained,verified, and reconciled?: Yes (Medications Reviewed) Any new allergies since your discharge?: No Dietary orders reviewed?: Yes Do you have support at home?: Yes  Medications Reviewed Today: Medications Reviewed Today   Medications were not reviewed in this encounter     Home Care and Equipment/Supplies: Were Home Health Services Ordered?: No Any new equipment or medical supplies ordered?: No  Functional Questionnaire: Do you need assistance with bathing/showering or dressing?: No Do you need assistance with meal preparation?: No Do you need assistance with eating?: No Do you have difficulty maintaining continence: No Do you need assistance with getting out of bed/getting out of a chair/moving?: No Do you have difficulty managing or taking your medications?: No  Follow up appointments reviewed: PCP Follow-up appointment confirmed?: Yes Date of PCP follow-up appointment?: 07/03/23 Follow-up Provider: Saralyn Pilar DO Specialist Hospital Follow-up  appointment confirmed?: No Reason Specialist Follow-Up Not Confirmed: Patient has Specialist Provider Number and will Call for Appointment Do you need transportation to your follow-up appointment?: No Do you understand care options if your condition(s) worsen?: Yes-patient verbalized understanding    SIGNATURE  Woodfin Ganja LPN [facility] Nurse Health Advisor Direct Dial [phone]

## 2023-07-03 ENCOUNTER — Encounter: Payer: Self-pay | Admitting: Family Medicine

## 2023-07-03 ENCOUNTER — Ambulatory Visit: Payer: Managed Care, Other (non HMO) | Admitting: Family Medicine

## 2023-07-03 VITALS — BP 136/87 | HR 66 | Ht 67.0 in | Wt 250.0 lb

## 2023-07-03 DIAGNOSIS — R110 Nausea: Secondary | ICD-10-CM

## 2023-07-03 DIAGNOSIS — I2699 Other pulmonary embolism without acute cor pulmonale: Secondary | ICD-10-CM | POA: Diagnosis not present

## 2023-07-03 DIAGNOSIS — Z7901 Long term (current) use of anticoagulants: Secondary | ICD-10-CM

## 2023-07-03 MED ORDER — ONDANSETRON 4 MG PO TBDP
4.0000 mg | ORAL_TABLET | Freq: Three times a day (TID) | ORAL | 0 refills | Status: DC | PRN
Start: 2023-07-03 — End: 2023-07-06

## 2023-07-03 NOTE — Patient Instructions (Addendum)
Thank you for coming to the office today.  Start the Xarelto starter pack 15mg  with meal twice a day and then extra 9 pills + 7 extra Xarelto 20mg  daily with meal.  I will refer you to our clinical Pharmacist - Gentry Fitz, she will call you to discuss coverage options on these medications, possibly switch to the Eliquis depending on the availability of a program.  Okay to take the pain medicine when ready.  You may return to [facility] if you want for the shoulder or we can talk again on it in the future.  Please schedule a Follow-up Appointment to: Return in about 4 weeks (around 07/31/2023) for Follow-up within 4 weeks PE / Blood thinner.  If you have any other questions or concerns, please feel free to call the office or send a message through MyChart. You may also schedule an earlier appointment if necessary.  Additionally, you may be receiving a survey about your experience at our office within a few days to 1 week by e-mail or mail. We value your feedback.  Saralyn Pilar, DO [facility], [place]

## 2023-07-03 NOTE — Progress Notes (Unsigned)
Subjective:    Patient ID: Benjamin Delacruz, male    DOB: [birth year], 57 y.o.   MRN: [id number]  Benjamin Delacruz is a 57 y.o. 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 He had been on Xarelto 20mg  daily, it was ordered by me in October 2023 for 1 year supply 90 day with 3 refills. He states that he ran into a problem recently in   He was placed on Eliquis on discharge from hospital. Cost is very high. >$600  Reviewed Hypercoaguable panel results from 06/29/23, all negative so far, including Protein S and Protein C  ***referral to [facility]   Right Shoulder Impingement / DJD He has seen [facility] 02/2023 ***  ***  Please obtain labs/tests: CBC, BMP, BP recheck  in 1-2 weeks Will need refills on eliquis  May need hematology f/u  May need referral to orthopedics if persistent shoulder pain  Please follow up on the following pending results: hypercoagulation panel   ***CCM Pharmacy referral, cost on anticoagulation  HOSPITAL FOLLOW-UP VISIT  Hospital/Location: [facility] Date of Admission: 06/28/23 Date of Discharge: 06/30/23 Transitions of care telephone call: ***  Reason for Admission: *** Primary (+Secondary) Diagnosis: *** (***, ***)  FOLLOW-UP *** - Hospital H&P and Discharge Summary have been reviewed - Patient presents today about *** days *** weeks after recent hospitalization. Brief summary of recent course, patient had symptoms of *** for ***, hospitalized, *** treated with ***. - Today reports overall has ***done well after discharge. Symptoms of *** ***have resolved ***are much improved ***seem to be worsening again. - New medications on discharge: *** - Changes to current  meds on discharge: ***  ***FOR A&P I have reviewed the discharge medication list, and have reconciled the current and discharge medications today.   Health Maintenance: ***     07/03/2023    9:23 AM 10/04/2022    8:26 AM 10/04/2022    8:18 AM  Depression screen PHQ 2/9  Decreased Interest 0 1 0  Down, Depressed, Hopeless 0 0 0  PHQ - 2 Score 0 1 0  Altered sleeping 0 1   Tired, decreased energy 0 1   Change in appetite 0 0   Feeling bad or failure about yourself  0 0   Trouble concentrating 0 0   Moving slowly or fidgety/restless 0 0   Suicidal thoughts 0 0   PHQ-9 Score 0 3   Difficult doing work/chores Not difficult at all Not difficult at all     Social History   Tobacco Use   Smoking status: Never   Smokeless tobacco: Never  Vaping Use   Vaping status: Never Used  Substance Use Topics   Alcohol use: Not Currently   Drug use: Never    Review of Systems Per HPI unless specifically indicated above     Objective:    BP 136/87   Pulse 66   Ht 5\' 7"  (1.702 m)   Wt 250 lb (113.4 kg)   SpO2 97%   BMI 39.16 kg/m   Wt Readings from Last 3 Encounters:  07/03/23 250 lb (113.4 kg)  06/28/23 248 lb (112.5 kg)  10/04/22 251 lb  12.8 oz (114.2 kg)    Physical Exam Results for orders placed or performed during the hospital encounter of 06/28/23  SARS Coronavirus 2 by RT PCR (hospital order, performed in [facility] hospital lab) *cepheid single result test* Anterior Nasal Swab   Specimen: Anterior Nasal Swab  Result Value Ref Range   SARS Coronavirus 2 by RT PCR NEGATIVE NEGATIVE  CBC  Result Value Ref Range   WBC 9.1 4.0 - 10.5 K/uL   RBC 4.80 4.22 - 5.81 MIL/uL   Hemoglobin 14.1 13.0 - 17.0 g/dL   HCT 44.0 10.2 - 72.5 %   MCV 86.3 80.0 - 100.0 fL   MCH 29.4 26.0 - 34.0 pg   MCHC 34.1 30.0 - 36.0 g/dL   RDW 36.6 44.0 - 34.7 %   Platelets 196 150 - 400 K/uL   nRBC 0.0 0.0 - 0.2 %  Comprehensive metabolic panel  Result Value Ref Range   Sodium 139 135 -  145 mmol/L   Potassium 3.8 3.5 - 5.1 mmol/L   Chloride 105 98 - 111 mmol/L   CO2 24 22 - 32 mmol/L   Glucose, Bld 88 70 - 99 mg/dL   BUN 13 6 - 20 mg/dL   Creatinine, Ser 4.25 (H) 0.61 - 1.24 mg/dL   Calcium 9.3 8.9 - 95.6 mg/dL   Total Protein 7.6 6.5 - 8.1 g/dL   Albumin 4.4 3.5 - 5.0 g/dL   AST 23 15 - 41 U/L   ALT 13 0 - 44 U/L   Alkaline Phosphatase 87 38 - 126 U/L   Total Bilirubin 1.3 (H) 0.3 - 1.2 mg/dL   GFR, Estimated 56 (L) >60 mL/min   Anion gap 10 5 - 15  CBC  Result Value Ref Range   WBC 7.7 4.0 - 10.5 K/uL   RBC 4.40 4.22 - 5.81 MIL/uL   Hemoglobin 12.9 (L) 13.0 - 17.0 g/dL   HCT 38.7 56.4 - 33.2 %   MCV 89.8 80.0 - 100.0 fL   MCH 29.3 26.0 - 34.0 pg   MCHC 32.7 30.0 - 36.0 g/dL   RDW 95.1 88.4 - 16.6 %   Platelets 165 150 - 400 K/uL   nRBC 0.0 0.0 - 0.2 %  Basic metabolic panel  Result Value Ref Range   Sodium 143 135 - 145 mmol/L   Potassium 4.3 3.5 - 5.1 mmol/L   Chloride 110 98 - 111 mmol/L   CO2 24 22 - 32 mmol/L   Glucose, Bld 115 (H) 70 - 99 mg/dL   BUN 13 6 - 20 mg/dL   Creatinine, Ser 0.63 (H) 0.61 - 1.24 mg/dL   Calcium 9.0 8.9 - 01.6 mg/dL   GFR, Estimated >01 >09 mL/min   Anion gap 9 5 - 15  Protein C activity  Result Value Ref Range   Protein C Activity 114 73 - 180 %  Protein C, total  Result Value Ref Range   Protein C, Total 87 60 - 150 %  Protein S activity  Result Value Ref Range   Protein S Activity 84 63 - 140 %  Protein S, total  Result Value Ref Range   Protein S Ag, Total 94 60 - 150 %  Beta-2-glycoprotein i abs, IgG/M/A  Result Value Ref Range   Beta-2 Glyco I IgG <9 0 - 20 GPI IgG units   Beta-2-Glycoprotein I IgM <9 0 - 32 GPI IgM units   Beta-2-Glycoprotein I IgA <9 0 - 25 GPI IgA units  Homocysteine, serum  Result Value Ref Range   Homocysteine 10.1 0.0 - 14.5 umol/L  Cardiolipin antibodies, IgG, IgM, IgA  Result Value Ref Range   Anticardiolipin IgG <9 0 - 14 GPL U/mL   Anticardiolipin IgM <9 0 - 12 MPL U/mL    Anticardiolipin IgA <9 0 - 11 APL U/mL  Antithrombin III  Result Value Ref Range   AntiThromb III Func 109 75 - 120 %  HIV Antibody (routine testing w rflx)  Result Value Ref Range   HIV Screen 4th Generation wRfx Non Reactive Non Reactive  Lupus anticoagulant panel  Result Value Ref Range   PTT Lupus Anticoagulant 28.5 0.0 - 43.5 sec   DRVVT 35.9 0.0 - 47.0 sec   Lupus Anticoag Interp Comment:   Heparin level (unfractionated)  Result Value Ref Range   Heparin Unfractionated <0.10 (L) 0.30 - 0.70 IU/mL  Protime-INR  Result Value Ref Range   Prothrombin Time 13.6 11.4 - 15.2 seconds   INR 1.0 0.8 - 1.2  APTT  Result Value Ref Range   aPTT 25 24 - 36 seconds  Heparin level (unfractionated)  Result Value Ref Range   Heparin Unfractionated 0.67 0.30 - 0.70 IU/mL  Heparin level (unfractionated)  Result Value Ref Range   Heparin Unfractionated 0.97 (H) 0.30 - 0.70 IU/mL  Heparin level (unfractionated)  Result Value Ref Range   Heparin Unfractionated >1.10 (H) 0.30 - 0.70 IU/mL  CBC  Result Value Ref Range   WBC 6.8 4.0 - 10.5 K/uL   RBC 4.07 (L) 4.22 - 5.81 MIL/uL   Hemoglobin 12.1 (L) 13.0 - 17.0 g/dL   HCT 84.1 (L) 32.4 - 40.1 %   MCV 88.7 80.0 - 100.0 fL   MCH 29.7 26.0 - 34.0 pg   MCHC 33.5 30.0 - 36.0 g/dL   RDW 02.7 25.3 - 66.4 %   Platelets 159 150 - 400 K/uL   nRBC 0.0 0.0 - 0.2 %  ECHOCARDIOGRAM COMPLETE  Result Value Ref Range   Weight 3,968 oz   Height 67 in   BP 109/82 mmHg   Ao pk vel 1.19 m/s   AV Area VTI 3.75 cm2   AR max vel 3.35 cm2   AV Mean grad 3.0 mmHg   AV Peak grad 5.7 mmHg   S' Lateral 2.70 cm   AV Area mean vel 3.73 cm2   Area-P 1/2 4.01 cm2   MV VTI 2.85 cm2   Est EF 60 - 65%   Troponin I (High Sensitivity)  Result Value Ref Range   Troponin I (High Sensitivity) 4 <18 ng/L  Troponin I (High Sensitivity)  Result Value Ref Range   Troponin I (High Sensitivity) 4 <18 ng/L      Assessment & Plan:   Problem List Items Addressed This  Visit     Chronic anticoagulation   Recurrent pulmonary emboli (HCC) - Primary   Other Visit Diagnoses     Nausea       Relevant Medications   ondansetron (ZOFRAN-ODT) 4 MG disintegrating tablet       Meds ordered this encounter  Medications   ondansetron (ZOFRAN-ODT) 4 MG disintegrating tablet    Sig: Take 1 tablet (4 mg total) by mouth every 8 (eight) hours as needed for nausea or vomiting.    Dispense:  30 tablet    Refill:  0      Follow up plan: Return in about 4 weeks (around 07/31/2023) for Follow-up within 4 weeks PE / Blood  thinner.  Future labs ordered for ***   Saralyn Pilar, DO [facility] 07/03/2023, 10:00 AM

## 2023-07-04 ENCOUNTER — Encounter: Payer: Self-pay | Admitting: Family Medicine

## 2023-07-04 ENCOUNTER — Ambulatory Visit: Payer: Managed Care, Other (non HMO) | Admitting: Pharmacist

## 2023-07-04 ENCOUNTER — Other Ambulatory Visit: Payer: Self-pay | Admitting: Family Medicine

## 2023-07-04 DIAGNOSIS — I2699 Other pulmonary embolism without acute cor pulmonale: Secondary | ICD-10-CM

## 2023-07-04 DIAGNOSIS — Z7901 Long term (current) use of anticoagulants: Secondary | ICD-10-CM

## 2023-07-04 MED ORDER — APIXABAN 5 MG PO TABS: 5.0000 mg | ORAL_TABLET | Freq: Two times a day (BID) | ORAL | 12 refills | Status: DC

## 2023-07-04 NOTE — Patient Instructions (Signed)
Goals Addressed             This Visit's Progress    Pharmacy Goals       I look forward to speaking with you again on 07/16/2023 at 3:30 PM. Please have your medications with you for [place] to review during this telephone call.  Thank you!  Estelle Grumbles, PharmD, [facility] [phone]

## 2023-07-04 NOTE — Progress Notes (Signed)
07/04/2023 Name: Benjamin Delacruz MRN: [id number] DOB: [birth year]  Chief Complaint  Patient presents with   Medication Assistance    Benjamin Delacruz is a 57 y.o. year old male who presented for a telephone visit.   They were referred to the pharmacist by their PCP for assistance in managing medication access.    Subjective:  Care Team: Primary Care Provider: Smitty Cords, DO ; Next Scheduled Visit: 07/31/2023   Medication Access/Adherence  Current Pharmacy:  [id number] [place], [place] - [address] AT [facility] [address] [place] [address] Phone: [phone] Fax: [phone]   Patient reports affordability concerns with their medications: Yes  Patient reports access/transportation concerns to their pharmacy: No  Patient reports adherence concerns with their medications:  No    From review of chart, note patient admitted to [facility] 06/28/2023 to 06/30/2023 related to recurrent pulmonary emboli. At hospital discharge, provider advised patient:  - Follow up with PCP for Office Visit within 1-2 weeks - STOP taking:  HCTZ  Terbinafine  Xarelto - START taking:  Eliquis 5 mg tablet - 2 tablets (10 mg total) by mouth 2 (two) times daily for 7 days, THEN 1 tablet (5 mg total) 2 (two) times daily   - CHANGE how taking:  Carvedilol 6.25 mg - Take 0.5 tablets (3.125 mg total) by mouth 2 (two) times daily  Furosemide 20 mg - Take 1 tablet (20 mg total) by mouth ONCE OR TWICE daily (total daily dose maximum 40 mg) as needed for up to 3 days for increased leg swelling, weight gain 5+ lbs over 1-2 days. Seek medical care if these symptoms are not improving with increased dose     History of Venous thromboembolism (DVT and PE):  Current treatment: Xarelto 15mg  with meal twice a day for 21 days, then Xarelto 20mg  daily with meal (from sample from office, which provides 30 day supply total) - Reports started taking as directed yesterday  (07/03/2023)  Previous therapies tried: Xarelto (stopped at hospital discharge; changed to Eliquis); Eliquis (cost)  Reports had been unable to obtain Eliquis prescription from pharmacy due to cost.  Objective:  Lab Results  Component Value Date   CREATININE 1.29 (H) 06/29/2023   BUN 13 06/29/2023   NA 143 06/29/2023   K 4.3 06/29/2023   CL 110 06/29/2023   CO2 24 06/29/2023    Lab Results  Component Value Date   CHOL 248 (H) 09/27/2022   HDL 48 09/27/2022   LDLCALC 159 (H) 09/27/2022   TRIG 248 (H) 09/27/2022   CHOLHDL 5.2 (H) 09/27/2022    Medications Reviewed Today     Reviewed by Manuela Neptune, RPH-CPP (Pharmacist) on 07/04/23 at 1434  Med List Status: <None>   Medication Order Taking? Sig Documenting Provider Last Dose Status Informant  apixaban (ELIQUIS) 5 MG TABS tablet [id number]  Take 1 tablet (5 mg total) by mouth 2 (two) times daily. Karamalegos, Netta Neat, DO  Active   atorvastatin (LIPITOR) 20 MG tablet [id number] No Take 1 tablet (20 mg total) by mouth at bedtime. Smitty Cords, DO Taking Active Self  carvedilol (COREG) 6.25 MG tablet [id number] No Take 0.5 tablets (3.125 mg total) by mouth 2 (two) times daily. Sunnie Nielsen, DO Taking Active   diclofenac Sodium (VOLTAREN) 1 % GEL [id number] No Apply 4 g topically 4 (four) times daily. To affected joint as needed for pain Sunnie Nielsen, DO Taking Active  furosemide (LASIX) 20 MG tablet [id number] No Take 1 tablet (20 mg total) by mouth ONCE OR TWICE daily (total daily dose maximum 40 mg) as needed for up to 3 days for increased leg swelling, weight gain 5+ lbs over 1-2 days. Seek medical care if these symptoms are not improving with increased dose. Sunnie Nielsen, DO Taking Active   ipratropium (ATROVENT) 0.06 % nasal spray [id number] No Place 2 sprays into both nostrils 4 (four) times daily as needed for rhinitis. Smitty Cords, DO Taking Active Self  latanoprost (XALATAN)  0.005 % ophthalmic solution [id number] No Place 1 drop into both eyes at bedtime. [provider] Taking Active   ondansetron (ZOFRAN-ODT) 4 MG disintegrating tablet [id number]  Take 1 tablet (4 mg total) by mouth every 8 (eight) hours as needed for nausea or vomiting. Karamalegos, Netta Neat, DO  Active   oxyCODONE (ROXICODONE) 5 MG immediate release tablet [id number] No Take 0.5-1 tablets (2.5-5 mg total) by mouth every 6 (six) hours as needed for up to 5 days for severe pain.  Patient not taking: Reported on 07/02/2023   Sunnie Nielsen, DO Not Taking Active            Med Note Julieanne Manson, REBECCA H   Mon Jul 02, 2023 10:21 AM) Didn't pick this up- pharmacy was out    pantoprazole (PROTONIX) 40 MG tablet [id number] No Take 1 tablet (40 mg total) by mouth daily before breakfast. Smitty Cords, DO Taking Active Self           Med Note Julieanne Manson, REBECCA H   Mon Jul 02, 2023 10:20 AM) PRN only                Assessment/Plan:   Unable to complete medication review today as patient not currently home; agree to complete this review during our next telephone call  History of Venous thromboembolism (DVT and PE)/ Medication Access: - Outreach to [employer] on behalf of patient and speak with Saunders Revel. Identify that pharmacy has been processing patient's Eliquis prescription through a Medicare Part D plan, rather than through patient's [employer] commercial plan.   Request pharmacy reprocess through [employer] and copayment is $40 - Collaborate with PCP to request provider send new Eliquis Rx to pharmacy for continuation dose of Eliquis 5 mg twice daily for patient to start AFTER uses up current supply of Xarelto from sample from PCP Counsel patient that after he uses up supply of Xarelto from sample, first dose of Eliquis should be given the next day at the time that the next dose of Xarelto would have been given - Provide pharmacy with billing information for  Eliquis co-pay savings card; confirm copay now $10 with savings card   Follow Up Plan: Clinical Pharmacist will follow up with patient by telephone on 07/16/2023 at 3:30 PM   Estelle Grumbles, PharmD, [facility] [phone]

## 2023-07-06 ENCOUNTER — Other Ambulatory Visit: Payer: Self-pay

## 2023-07-06 ENCOUNTER — Emergency Department
Admission: EM | Admit: 2023-07-06 | Discharge: 2023-07-06 | Disposition: A | Discharge status: Home / Self Care | Payer: Managed Care, Other (non HMO) | Attending: Emergency Medicine | Admitting: Emergency Medicine

## 2023-07-06 ENCOUNTER — Emergency Department: Payer: Managed Care, Other (non HMO)

## 2023-07-06 DIAGNOSIS — I2510 Atherosclerotic heart disease of native coronary artery without angina pectoris: Secondary | ICD-10-CM | POA: Insufficient documentation

## 2023-07-06 DIAGNOSIS — R0789 Other chest pain: Secondary | ICD-10-CM

## 2023-07-06 DIAGNOSIS — J40 Bronchitis, not specified as acute or chronic: Secondary | ICD-10-CM | POA: Diagnosis not present

## 2023-07-06 DIAGNOSIS — Z7901 Long term (current) use of anticoagulants: Secondary | ICD-10-CM | POA: Insufficient documentation

## 2023-07-06 DIAGNOSIS — I10 Essential (primary) hypertension: Secondary | ICD-10-CM | POA: Diagnosis not present

## 2023-07-06 DIAGNOSIS — R079 Chest pain, unspecified: Secondary | ICD-10-CM | POA: Diagnosis present

## 2023-07-06 LAB — BASIC METABOLIC PANEL
Anion gap: 10 (ref 5–15)
BUN: 13 mg/dL (ref 6–20)
CO2: 22 mmol/L (ref 22–32)
Calcium: 9.2 mg/dL (ref 8.9–10.3)
Chloride: 104 mmol/L (ref 98–111)
Creatinine, Ser: 1.24 mg/dL (ref 0.61–1.24)
GFR, Estimated: >60 mL/min (ref >60)
Glucose, Bld: 96 mg/dL (ref 70–99)
Potassium: 3.6 mmol/L (ref 3.5–5.1)
Sodium: 136 mmol/L (ref 135–145)

## 2023-07-06 LAB — CBC
HCT: 39.6 % (ref 39.0–52.0)
Hemoglobin: 13.6 g/dL (ref 13.0–17.0)
MCH: 29.4 pg (ref 26.0–34.0)
MCHC: 34.3 g/dL (ref 30.0–36.0)
MCV: 85.5 fL (ref 80.0–100.0)
Platelets: 207 10*3/uL (ref 150–400)
RBC: 4.63 MIL/uL (ref 4.22–5.81)
RDW: 12.9 % (ref 11.5–15.5)
WBC: 7.7 10*3/uL (ref 4.0–10.5)
nRBC: 0 % (ref 0.0–0.2)

## 2023-07-06 LAB — D-DIMER, QUANTITATIVE: D-Dimer, Quant: 0.44 ug{FEU}/mL (ref 0.00–0.50)

## 2023-07-06 LAB — TROPONIN I (HIGH SENSITIVITY): Troponin I (High Sensitivity): 4 ng/L (ref <18)

## 2023-07-06 MED ORDER — OXYCODONE-ACETAMINOPHEN 5-325 MG PO TABS: 1.0000 | ORAL_TABLET | Freq: Four times a day (QID) | ORAL | 0 refills | Status: AC | PRN

## 2023-07-06 MED ORDER — ONDANSETRON 4 MG PO TBDP
4.0000 mg | ORAL_TABLET | Freq: Once | ORAL | Status: AC
  Administered 2023-07-06: 4 mg via ORAL
  Filled 2023-07-06: qty 1

## 2023-07-06 MED ORDER — IOHEXOL 350 MG/ML SOLN
75.0000 mL | Freq: Once | INTRAVENOUS | Status: AC | PRN
  Administered 2023-07-06: 75 mL via INTRAVENOUS

## 2023-07-06 MED ORDER — ONDANSETRON 4 MG PO TBDP: 4.0000 mg | ORAL_TABLET | Freq: Three times a day (TID) | ORAL | 0 refills | Status: DC | PRN

## 2023-07-06 MED ORDER — OXYCODONE-ACETAMINOPHEN 5-325 MG PO TABS
1.0000 | ORAL_TABLET | Freq: Once | ORAL | Status: AC
  Administered 2023-07-06: 1 via ORAL
  Filled 2023-07-06: qty 1

## 2023-07-06 MED ORDER — ALBUTEROL SULFATE HFA 108 (90 BASE) MCG/ACT IN AERS: 2.0000 | INHALATION_SPRAY | RESPIRATORY_TRACT | 0 refills | Status: AC | PRN

## 2023-07-06 NOTE — ED Provider Notes (Signed)
[facility] Provider Note    Event Date/Time   First MD Initiated Contact with Patient 07/06/23 1405     (approximate)   History   Chest Pain   HPI  Benjamin Delacruz is a 57 y.o. male with a history of DVT and PE on Xarelto, hypertension, hyperlipidemia, CAD, and OSA who presents with persistent right-sided chest pain along with increased shortness of breath as well as diaphoresis and weakness with exertion.  He states that the pain has mostly stayed the same since he was admitted for PE last week, but the other symptoms worsened over the last 2 days as he tried to return to work.  He denies any new or worsening leg swelling.  He states that he has been on anticoagulation without interruption since he was in the hospital, however he could not afford the Eliquis and so was switched by his PMD to Xarelto temporarily.  He has worked with a prescription program and will be switched back to Eliquis early next month.  I reviewed past medical records.  The patient was most recently admitted to the hospitalist service earlier this month and discharged on 8/24 after presenting with acute onset of chest pain and diagnosed with acute PE.  His Xarelto was switched to Eliquis.   Physical Exam   Triage Vital Signs: ED Triage Vitals  Encounter Vitals Group     BP 07/06/23 1159 (!) 147/93     Systolic BP Percentile --      Diastolic BP Percentile --      Pulse Rate 07/06/23 1159 70     Resp 07/06/23 1159 16     Temp 07/06/23 1159 98.7 F (37.1 C)     Temp Source 07/06/23 1159 Oral     SpO2 07/06/23 1159 98 %     Weight 07/06/23 1159 245 lb (111.1 kg)     Height 07/06/23 1159 5\' 7"  (1.702 m)     Head Circumference --      Peak Flow --      Pain Score 07/06/23 1205 9     Pain Loc --      Pain Education --      Exclude from Growth Chart --     Most recent vital signs: Vitals:   07/06/23 1515 07/06/23 1530  BP:    Pulse: (!) 55 (!) 55  Resp:    Temp:    SpO2: 98%  96%     General: Awake, no distress.  CV:  Good peripheral perfusion.  Resp:  Normal effort.  Abd:  No distention.  Other:  No peripheral edema.   ED Results / Procedures / Treatments   Labs (all labs ordered are listed, but only abnormal results are displayed) Labs Reviewed  BASIC METABOLIC PANEL  CBC  D-DIMER, QUANTITATIVE  TROPONIN I (HIGH SENSITIVITY)     EKG  ED ECG REPORT I, Dionne Bucy, the attending physician, personally viewed and interpreted this ECG.  Date: 07/06/2023 EKG Time: 1210 Rate: 66 Rhythm: normal sinus rhythm QRS Axis: normal Intervals: normal ST/T Wave abnormalities: normal Narrative Interpretation: no evidence of acute ischemia   RADIOLOGY  Chest x-ray: I independently viewed and interpreted the images; there is no focal consolidation or edema  CT angio chest:   IMPRESSION:  1. Redemonstrated the right lower lobe subsegmental PE. No new PE  identified. IVC filter in place.  2. Mild bibasilar bronchial wall thickening, which can be seen in  the setting of bronchitis.  PROCEDURES:  Critical Care performed: No  Procedures   MEDICATIONS ORDERED IN ED: Medications  oxyCODONE-acetaminophen (PERCOCET/ROXICET) 5-325 MG per tablet 1 tablet (1 tablet Oral Given 07/06/23 1503)  ondansetron (ZOFRAN-ODT) disintegrating tablet 4 mg (4 mg Oral Given 07/06/23 1503)  iohexol (OMNIPAQUE) 350 MG/ML injection 75 mL (75 mLs Intravenous Contrast Given 07/06/23 1640)     IMPRESSION / MDM / ASSESSMENT AND PLAN / ED COURSE  I reviewed the triage vital signs and the nursing notes.  57 year old male with PMH as noted above and recent diagnosis of recurrent PE presents with persistent right-sided chest pain and increased shortness of breath and diaphoresis on exertion.  On exam he is overall well-appearing with normal vital signs.  EKG is nonischemic.  Lab workup reveals normal D-dimer and troponin.  Differential diagnosis includes, but is not  limited to, musculoskeletal pain, continued pain and symptoms related to existing PE versus new or worsening PE.  There is no evidence of aortic dissection, other vascular cause, or ACS.  Patient's presentation is most consistent with acute presentation with potential threat to life or bodily function.  The patient is on the cardiac monitor to evaluate for evidence of arrhythmia and/or significant heart rate changes.   I had an extensive discussion with the patient about indication for further workup and risks and benefits of repeat imaging.  Although his vitals are normal and lab workup is reassuring so far, he also had similar normal vital signs during his last ED visit when he turned out to have a new PE.  The patient is concerned for possible new or worsening clot burden.  Therefore, based on shared decision making we will obtain a repeat CT angio to rule out acute PE.  ----------------------------------------- 5:22 PM on 07/06/2023 -----------------------------------------  CTA shows no evidence of new PE.  It does does show some findings of possible bronchitis.  The patient is stable for discharge home.  I counseled him on the results of the workup and plan of care.  I have prescribed analgesia as well as an albuterol inhaler.  I instructed him to follow-up with his PMD and gave strict return precautions; he expressed understanding.   FINAL CLINICAL IMPRESSION(S) / ED DIAGNOSES   Final diagnoses:  Atypical chest pain  Bronchitis     Rx / DC Orders   ED Discharge Orders          Ordered    oxyCODONE-acetaminophen (PERCOCET) 5-325 MG tablet  Every 6 hours PRN        07/06/23 1721    albuterol (VENTOLIN HFA) 108 (90 Base) MCG/ACT inhaler  Every 4 hours PRN        07/06/23 1721    ondansetron (ZOFRAN-ODT) 4 MG disintegrating tablet  Every 8 hours PRN        07/06/23 1721             Note:  This document was prepared using Dragon voice recognition software and may include  unintentional dictation errors.    Dionne Bucy, MD 07/06/23 1723

## 2023-07-06 NOTE — ED Triage Notes (Signed)
Pt reports was admitted for a PE last week- c/o continued right sided CP and sweating that is worse with inspiration. Pt is Aox4, Nad noted, respirations even and unlabored.

## 2023-07-06 NOTE — Discharge Instructions (Addendum)
Your CT does not show any new blood clot.  Continue taking your Xarelto as you have been.  Follow-up with your primary care provider.  The scan is showing some signs of possible bronchitis.  Use the albuterol inhaler up to every 4 hours as needed for shortness of breath or chest tightness.  Take the oxycodone as needed for pain over the neck several days.  Return to the ER for new, worsening, or persistent severe chest pain, difficulty breathing, weakness or lightheadedness, or any other new or worsening symptoms that concern you.

## 2023-07-06 NOTE — ED Notes (Signed)
IV removed by ED tech June

## 2023-07-06 NOTE — ED Notes (Signed)
Two unsuccessful IV attempts. MD aware. 

## 2023-07-10 LAB — FACTOR 5 LEIDEN

## 2023-07-10 LAB — PROTHROMBIN GENE MUTATION

## 2023-07-16 ENCOUNTER — Telehealth: Payer: Self-pay | Admitting: Pharmacist

## 2023-07-16 ENCOUNTER — Telehealth: Payer: Managed Care, Other (non HMO)

## 2023-07-16 NOTE — Telephone Encounter (Signed)
   Outreach Note  07/16/2023 Name: Ezeriah Tarpey MRN: [id number] DOB: 1966-03-06  Referred by: Smitty Cords, DO  Reach patient by telephone. He requests to reschedule our appointment as he is not currently home  Follow Up Plan: Clinical Pharmacist will follow up with patient by telephone on 08/06/2023 at 8:30 am  Estelle Grumbles, PharmD, [facility] [phone]

## 2023-07-31 ENCOUNTER — Ambulatory Visit: Payer: Managed Care, Other (non HMO) | Admitting: Family Medicine

## 2023-08-06 ENCOUNTER — Ambulatory Visit: Payer: Managed Care, Other (non HMO) | Admitting: Pharmacist

## 2023-08-06 DIAGNOSIS — I10 Essential (primary) hypertension: Secondary | ICD-10-CM

## 2023-08-06 NOTE — Progress Notes (Unsigned)
08/06/2023 Name: Benjamin Delacruz MRN: [id number] DOB: 01-Jan-1966  Chief Complaint  Patient presents with   Medication Management    Benjamin Delacruz is a 57 y.o. year old male who presented for a telephone visit.   They were referred to the pharmacist by their PCP for assistance in managing medication access.    Subjective:  Care Team: Primary Care Provider: Smitty Cords, DO   Medication Access/Adherence  Current Pharmacy:  [facility] [phone] Cheree Ditto, [place] - [address] AT [facility] OF [address] [place] [address] Phone: [phone] Fax: [phone]   Patient reports affordability concerns with their medications: No  Patient reports access/transportation concerns to their pharmacy: No  Patient reports adherence concerns with their medications:  No    Uses weekly pillbox; denies missed doses  From review of chart, note patient missed recent office visit with PCP, scheduled for 9/24   Hypertension:  Current medications:  - carvedilol 6.25 mg - Take 0.5 tablets (3.125 mg total) by mouth 2 (two) times daily  - Furosemide 20 mg - Take 1 tablet (20 mg total) by mouth ONCE OR TWICE daily (total daily dose maximum 40 mg) as needed for up to 3 days for increased leg swelling, weight gain 5+ lbs over 1-2 days. Seek medical care if these symptoms are not improving with increased dose   Reports typically taking furosemide 20 mg once daily  Patient does not have an automated, upper arm home BP cuff; reports uses his wrist watch to monitor Current blood pressure readings readings: last checked yesterday, recalls reading: ~128/76  Patient admits to adding salt to his food  Patient denies hypotensive s/sx including dizziness, lightheadedness.   Patient request change in carvedilol prescription to allow for him to no longer need to split the tablets   History of Venous thromboembolism (DVT and PE):   Current treatment: Xarelto 20mg   daily with meal (from samples from PCP)   Previous therapies tried: Xarelto (stopped at hospital discharge; changed to Eliquis); Eliquis (cost)   Note patient previously unable to obtain Eliquis prescription from pharmacy following hospital discharge due to cost and was provided Xarelto samples by PCP.  - Eliquis cost barrier now resolved. Eliquis copayment through his [employer] + copay savings card: $10 for 30 day supply     Objective:   Lab Results  Component Value Date   CREATININE 1.24 07/06/2023   BUN 13 07/06/2023   NA 136 07/06/2023   K 3.6 07/06/2023   CL 104 07/06/2023   CO2 22 07/06/2023   BP Readings from Last 3 Encounters:  07/06/23 133/79  07/03/23 136/87  06/30/23 (!) 143/86   Pulse Readings from Last 3 Encounters:  07/06/23 (!) 52  07/03/23 66  06/30/23 82     Medications Reviewed Today     Reviewed by Manuela Neptune, RPH-CPP (Pharmacist) on 08/06/23 at [phone]  Med List Status: <None>   Medication Order Taking? Sig Documenting Provider Last Dose Status Informant  albuterol (VENTOLIN HFA) 108 (90 Base) MCG/ACT inhaler [id number] Yes Inhale 2 puffs into the lungs every 4 (four) hours as needed. Dionne Bucy, MD Taking Active   apixaban (ELIQUIS) 5 MG TABS tablet [id number]  Take 1 tablet (5 mg total) by mouth 2 (two) times daily. Smitty Cords, DO  Active   atorvastatin (LIPITOR) 20 MG tablet [id number] Yes Take 1 tablet (20 mg total) by mouth at bedtime. Smitty Cords, DO  Taking Active Self  carvedilol (COREG) 6.25 MG tablet [id number] Yes Take 0.5 tablets (3.125 mg total) by mouth 2 (two) times daily. Sunnie Nielsen, DO Taking Active   diclofenac Sodium (VOLTAREN) 1 % GEL [id number] Yes Apply 4 g topically 4 (four) times daily. To affected joint as needed for pain Sunnie Nielsen, DO Taking Active   furosemide (LASIX) 20 MG tablet [id number] Yes Take 1 tablet (20 mg total) by mouth ONCE OR TWICE daily (total daily dose  maximum 40 mg) as needed for up to 3 days for increased leg swelling, weight gain 5+ lbs over 1-2 days. Seek medical care if these symptoms are not improving with increased dose. Sunnie Nielsen, DO Taking Active   ipratropium (ATROVENT) 0.06 % nasal spray [id number] Yes Place 2 sprays into both nostrils 4 (four) times daily as needed for rhinitis. Smitty Cords, DO Taking Active Self  latanoprost (XALATAN) 0.005 % ophthalmic solution [id number] Yes Place 1 drop into both eyes at bedtime. [provider] Taking Active   ondansetron (ZOFRAN-ODT) 4 MG disintegrating tablet [id number] No Take 1 tablet (4 mg total) by mouth every 8 (eight) hours as needed.  Patient not taking: Reported on 08/06/2023   Dionne Bucy, MD Not Taking Active   pantoprazole (PROTONIX) 40 MG tablet [id number] Yes Take 1 tablet (40 mg total) by mouth daily before breakfast. Smitty Cords, DO Taking Active Self           Med Note Julieanne Manson, REBECCA H   Mon Jul 02, 2023 10:20 AM) PRN only                Assessment/Plan:   Comprehensive medication review performed; medication list updated in electronic medical record  Recommend patient contact PCP office today to reschedule missed appointment   Hypertension: - Currently controlled - Reviewed long term cardiovascular and renal outcomes of uncontrolled blood pressure - Reviewed appropriate blood pressure monitoring technique and reviewed goal blood pressure.  - Encourage patient to work on making dietary changes such as reducing salt/sodium intake - Encourage patient to consider obtaining a home upper arm blood pressure monitor for greater accuracy of readings, compared to wrist monitor - Recommend to continue to monitor home blood pressure, keep log of results and have this record to review at upcoming medical appointments. Patient to contact provider office sooner if needed for readings outside of established parameters or  symptoms - CPP will send new prescription for carvedilol 3.125 mg twice daily to pharmacy for patient as requested, to allow him to take a full tablet/dose, rather than needing to split tablet.  History of Venous thromboembolism (DVT and PE)/ Medication Access: - As previously discussed, patient plans to start Eliquis 5 mg twice daily AFTER uses up current supply of Xarelto from sample from PCP  Have counseled patient that after he uses up supply of Xarelto from sample, first dose of Eliquis should be given the next day at the time that the next dose of Xarelto would have been given     Follow Up Plan:   Patient denies further medication questions or concerns today Provide patient with contact information for clinic pharmacist to contact if needed in future for medication questions/concerns    Estelle Grumbles, PharmD, [facility] [phone]

## 2023-08-07 MED ORDER — CARVEDILOL 3.125 MG PO TABS
3.1250 mg | ORAL_TABLET | Freq: Two times a day (BID) | ORAL | 0 refills | Status: DC
Start: 2023-08-07 — End: 2023-11-21

## 2023-08-07 NOTE — Patient Instructions (Signed)
Check your blood pressure twice weekly, and any time you have concerning symptoms like headache, chest pain, dizziness, shortness of breath, or vision changes.   Our goal is less than 130/80.  To appropriately check your blood pressure, make sure you do the following:  1) Avoid caffeine, exercise, or tobacco products for 30 minutes before checking. Empty your bladder. 2) Sit with your back supported in a flat-backed chair. Rest your arm on something flat (arm of the chair, table, etc). 3) Sit still with your feet flat on the floor, resting, for at least 5 minutes.  4) Check your blood pressure. Take 1-2 readings.  5) Write down these readings and bring with you to any provider appointments.  Bring your home blood pressure machine with you to a provider's office for accuracy comparison at least once a year.   Make sure you take your blood pressure medications before you come to any office visit, even if you were asked to fast for labs.  Reagan Klemz Coren Crownover, PharmD, BCACP, CPP Clinical Pharmacist [facility] [place] [phone]  

## 2023-08-28 ENCOUNTER — Emergency Department: Payer: Federal, State, Local not specified - PPO

## 2023-08-28 ENCOUNTER — Encounter: Payer: Self-pay | Admitting: Emergency Medicine

## 2023-08-28 ENCOUNTER — Emergency Department
Admission: EM | Admit: 2023-08-28 | Discharge: 2023-08-28 | Disposition: A | Discharge status: Home / Self Care | Payer: Federal, State, Local not specified - PPO | Attending: Emergency Medicine | Admitting: Emergency Medicine

## 2023-08-28 ENCOUNTER — Other Ambulatory Visit: Payer: Self-pay

## 2023-08-28 DIAGNOSIS — R109 Unspecified abdominal pain: Secondary | ICD-10-CM | POA: Insufficient documentation

## 2023-08-28 DIAGNOSIS — R42 Dizziness and giddiness: Secondary | ICD-10-CM | POA: Insufficient documentation

## 2023-08-28 DIAGNOSIS — R791 Abnormal coagulation profile: Secondary | ICD-10-CM | POA: Diagnosis not present

## 2023-08-28 DIAGNOSIS — R930 Abnormal findings on diagnostic imaging of skull and head, not elsewhere classified: Secondary | ICD-10-CM

## 2023-08-28 DIAGNOSIS — R112 Nausea with vomiting, unspecified: Secondary | ICD-10-CM | POA: Diagnosis not present

## 2023-08-28 LAB — BASIC METABOLIC PANEL
Anion gap: 11 (ref 5–15)
BUN: 19 mg/dL (ref 6–20)
CO2: 22 mmol/L (ref 22–32)
Calcium: 9.3 mg/dL (ref 8.9–10.3)
Chloride: 105 mmol/L (ref 98–111)
Creatinine, Ser: 1.31 mg/dL — ABNORMAL HIGH (ref 0.61–1.24)
GFR, Estimated: >60 mL/min (ref >60)
Glucose, Bld: 109 mg/dL — ABNORMAL HIGH (ref 70–99)
Potassium: 3.8 mmol/L (ref 3.5–5.1)
Sodium: 138 mmol/L (ref 135–145)

## 2023-08-28 LAB — TROPONIN I (HIGH SENSITIVITY)
Troponin I (High Sensitivity): 5 ng/L (ref <18)
Troponin I (High Sensitivity): 4 ng/L (ref <18)

## 2023-08-28 LAB — CBC
HCT: 41.9 % (ref 39.0–52.0)
Hemoglobin: 14.4 g/dL (ref 13.0–17.0)
MCH: 29.1 pg (ref 26.0–34.0)
MCHC: 34.4 g/dL (ref 30.0–36.0)
MCV: 84.6 fL (ref 80.0–100.0)
Platelets: 202 10*3/uL (ref 150–400)
RBC: 4.95 MIL/uL (ref 4.22–5.81)
RDW: 12.6 % (ref 11.5–15.5)
WBC: 8.1 10*3/uL (ref 4.0–10.5)
nRBC: 0 % (ref 0.0–0.2)

## 2023-08-28 LAB — PROTIME-INR
INR: 1.1 (ref 0.8–1.2)
Prothrombin Time: 14.1 s (ref 11.4–15.2)

## 2023-08-28 MED ORDER — ONDANSETRON 8 MG PO TBDP: 8.0000 mg | ORAL_TABLET | Freq: Three times a day (TID) | ORAL | 0 refills | Status: AC | PRN

## 2023-08-28 MED ORDER — ONDANSETRON HCL 4 MG/2ML IJ SOLN
4.0000 mg | Freq: Once | INTRAMUSCULAR | Status: AC
  Administered 2023-08-28: 4 mg via INTRAVENOUS
  Filled 2023-08-28: qty 2

## 2023-08-28 MED ORDER — MECLIZINE HCL 25 MG PO TABS
25.0000 mg | ORAL_TABLET | Freq: Once | ORAL | Status: AC
  Administered 2023-08-28: 25 mg via ORAL
  Filled 2023-08-28: qty 1

## 2023-08-28 MED ORDER — MECLIZINE HCL 25 MG PO TABS: 25.0000 mg | ORAL_TABLET | Freq: Three times a day (TID) | ORAL | 0 refills | Status: DC | PRN

## 2023-08-28 MED ORDER — ACETAMINOPHEN 500 MG PO TABS
1000.0000 mg | ORAL_TABLET | Freq: Once | ORAL | Status: AC
  Administered 2023-08-28: 1000 mg via ORAL
  Filled 2023-08-28: qty 2

## 2023-08-28 MED ORDER — IOHEXOL 350 MG/ML SOLN
75.0000 mL | Freq: Once | INTRAVENOUS | Status: AC | PRN
  Administered 2023-08-28: 75 mL via INTRAVENOUS

## 2023-08-28 NOTE — Discharge Instructions (Signed)
Please seek medical attention for any high fevers, chest pain, shortness of breath, change in behavior, persistent vomiting, bloody stool or any other new or concerning symptoms.  

## 2023-08-28 NOTE — ED Notes (Signed)
Pt requesting for IV in right hand to be removed. IV removed per pt request.

## 2023-08-28 NOTE — Progress Notes (Addendum)
Telestroke [facility] - Code stroke Note  1302- Code stroke activation on cart- Dr Selina Cooley is in the room assessing pt. LKW 10am- vertigo, vomiting, and no focal deficits. RN taking pt to CT now.  1304- Pt taking Eliquis- no TNK, will obtain CTA/P to r/o LVO per Dr Selina Cooley.  1321- Pt returned to room from CT  MRS 0 NIH- 0

## 2023-08-28 NOTE — Progress Notes (Signed)
   08/28/23 1400  Spiritual Encounters  Type of Visit Initial  Care provided to: Pt and family  Referral source Code page  Reason for visit Urgent spiritual support  OnCall Visit Yes  Spiritual Framework  Presenting Themes Meaning/purpose/sources of inspiration;Significant life change  Patient Stress Factors Not reviewed  Family Stress Factors Not reviewed  Interventions  Spiritual Care Interventions Made Established relationship of care and support;Compassionate presence;Reflective listening  Intervention Outcomes  Outcomes Awareness of support;Connection to spiritual care  Spiritual Care Plan  Spiritual Care Issues Still Outstanding No further spiritual care needs at this time (see row info)   Spoke with patient and family let them know that Pastoral Care is here for them. Patient and family seem to enjoy the fact that Spiritual Care is around if they needed. Patient and family was please to know that Spiritual care is around if needed. I Let family know to just page the nurses and they will contact us to come service.

## 2023-08-28 NOTE — ED Notes (Signed)
Activated Code Stroke w/ [facility]) per Dr. Derrill Kay

## 2023-08-28 NOTE — ED Triage Notes (Signed)
Patient to ED via [facility] from work for CP. Also having N/V- blood in vomit per EMS. Does take blood thinners. Hx of MI. Had eye surgery last Thursday. CP started 30 mins ago. 9/10 centralized pressure. Given 4mg  Zofran by EMS.

## 2023-08-28 NOTE — ED Provider Notes (Signed)
Emergency department handoff note  Care of this patient was signed out to me at the end of the previous provider shift.  All pertinent patient information was conveyed and all questions were answered.  Patient pending MRI read of the brain that did not show any evidence of acute CVA.  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  I spoke with Dr. Selina Cooley and neurology recommended follow-up in the outpatient setting with the neurology team for evaluation of this mass and likely surveillance.  Patient is stable at this time with no new neurologic deficits and is requesting to go home at this time.  The patient has been reexamined and is ready to be discharged.  All diagnostic results have been reviewed and discussed with the patient/family.  Care plan has been outlined and the patient/family understands all current diagnoses, results, and treatment plans.  There are no new complaints, changes, or physical findings at this time.  All questions have been addressed and answered.  All medications, if any, that were given while in the emergency department or any that are being prescribed have been reviewed with the patient/family.  All side effects and adverse reactions have been explained.  Patient was instructed to, and agrees to follow-up with their primary care physician as well as return to the emergency department if any new or worsening symptoms develop.   Merwyn Katos, MD 08/28/23 [phone]

## 2023-08-28 NOTE — ED Notes (Signed)
Pt returned to room after CT.

## 2023-08-28 NOTE — ED Notes (Addendum)
Code Stroke called by Derrill Kay, MD at this time due to dizziness/ vertigo symptoms. Selina Cooley, MD at bedside. Pt taken to CT on the monitor with this RN and Brayton Caves, RN at this time.

## 2023-08-28 NOTE — ED Provider Notes (Signed)
[facility] Provider Note    Event Date/Time   First MD Initiated Contact with Patient 08/28/23 1209     (approximate)   History   Dizziness   HPI  Benjamin Delacruz is a 57 y.o. male who presents to the emergency department today because of concerns for acute onset of dizziness.  The patient states that he was working when all of a sudden he he felt extreme dizziness.  Felt the room spinning around him.  This was then accompanied by nausea and vomiting.  He did appreciate some blood in his vomit.  He did have some stomach pain with this that radiated up into his chest.  The time my exam he continues to feel dizzy.  He denies similar symptoms in the past.  Denies any recent illness or abnormal activities earlier in the day.      Physical Exam   Triage Vital Signs: ED Triage Vitals  Encounter Vitals Group     BP 08/28/23 1205 (!) 156/93     Systolic BP Percentile --      Diastolic BP Percentile --      Pulse Rate 08/28/23 1205 76     Resp 08/28/23 1205 19     Temp 08/28/23 1205 98.6 F (37 C)     Temp Source 08/28/23 1205 Oral     SpO2 08/28/23 1205 98 %     Weight 08/28/23 1203 248 lb (112.5 kg)     Height 08/28/23 1203 5\' 7"  (1.702 m)     Head Circumference --      Peak Flow --      Pain Score 08/28/23 1203 9     Pain Loc --      Pain Education --      Exclude from Growth Chart --     Most recent vital signs: Vitals:   08/28/23 1205  BP: (!) 156/93  Pulse: 76  Resp: 19  Temp: 98.6 F (37 C)  SpO2: 98%   General: Awake, alert, oriented. CV:  Good peripheral perfusion. Regular rate and rhythm. Resp:  Normal effort. Lungs clear. Abd:  No distention. Non tender.   ED Results / Procedures / Treatments   Labs (all labs ordered are listed, but only abnormal results are displayed) Labs Reviewed  BASIC METABOLIC PANEL - Abnormal; Notable for the following components:      Result Value   Glucose, Bld 109 (*)    Creatinine, Ser 1.31 (*)     All other components within normal limits  CBC  PROTIME-INR  TROPONIN I (HIGH SENSITIVITY)  TROPONIN I (HIGH SENSITIVITY)     EKG  I, Phineas Semen, attending physician, personally viewed and interpreted this EKG  EKG Time: 1206 Rate: 73 Rhythm: sinus rhythm Axis: left axis deviation Intervals: qtc 443 QRS: narrow, q waves v1 ST changes: no st elevation Impression: abnormal ekg   RADIOLOGY I independently interpreted and visualized the CXR. My interpretation: No pneumonia Radiology interpretation:  IMPRESSION:  No active disease.    I independently interpreted and visualized the CT head. My interpretation: No bleed Radiology interpretation:  IMPRESSION:  CT head:    1. No evidence of acute intracranial hemorrhage or an acute infarct.  2. Nonspecific prominence of the pituitary gland with slight  suprasellar bulging. A non-emergent pituitary protocol brain MRI  (with and without contrast) is recommended to exclude a pituitary  mass.    CTA neck:    1. The right vertebral artery is diminutive,  but appears patent  throughout the neck.  2. The left vertebral artery is patent within the neck without  stenosis or significant atherosclerotic disease.  3. The common carotid and internal carotid arteries are patent  within the neck without stenosis or significant atherosclerotic  disease.    CTA head:    1. The right posterior inferior cerebellar artery (PICA) is poorly  delineated proximally. This may be due to developmentally small  vessel size. However, vessel occlusion cannot be excluded. Consider  brain MRI for further evaluation.  2. Elsewhere, there is no evidence of a proximal intracranial large  vessel occlusion.     PROCEDURES:  Critical Care performed: No    MEDICATIONS ORDERED IN ED: Medications - No data to display   IMPRESSION / MDM / ASSESSMENT AND PLAN / ED COURSE  I reviewed the triage vital signs and the nursing notes.                               Differential diagnosis includes, but is not limited to, vertigo, CVA, anemia, electrolyte abnormality  Patient's presentation is most consistent with acute presentation with potential threat to life or bodily function.   The patient is on the cardiac monitor to evaluate for evidence of arrhythmia and/or significant heart rate changes.  Patient presented to the emergency department today because of concerns for acute onset of dizziness with a sensation of the room spinning around him which was accompanied by nausea and vomiting.  Patient's blood work without concerning leukocytosis, anemia, electrolyte abnormality or troponin elevation.  Given acute onset of dizziness did activate a code stroke.  Patient is not a TNK candidate given anticoagulation.  Dr. Selina Cooley with neurology did evaluate and is concern for possible stroke.  Recommended MRI.   MRI pending at time of sign out   FINAL CLINICAL IMPRESSION(S) / ED DIAGNOSES   Final diagnoses:  Dizziness      Note:  This document was prepared using Dragon voice recognition software and may include unintentional dictation errors.    Phineas Semen, MD 08/28/23 [phone]

## 2023-08-28 NOTE — Consult Note (Signed)
NEUROLOGY CONSULTATION NOTE   Date of service: August 28, 2023 Patient Name: Benjamin Delacruz MRN:  [id number] DOB:  January 29, 1966 Reason for consult: stroke code Requesting physician: Dr. Phineas Semen _ _ _   _ __   _ __ _ _  __ __   _ __   __ _  History of Present Illness   This is a 57 yo man with pmhx significant for DVT/PE on eliquis, HL, MI who presents with severe vertigo and n/v. Acute onset sx at 1000 while he was eating breakfast. No focal neurologic deficits NIHSS = 0. Head CT showed no hemorrhage or e/o evolving ischemia. TNK was not administered 2/2 patient on eliquis. CTA H&N showed no LVO. CNS imaging personally reviewed.   ROS   Per HPI: all other systems reviewed and are negative  Past History   I have reviewed the following:  Past Medical History:  Diagnosis Date   DVT (deep venous thrombosis) (HCC)    Hypercholesteremia    MI (myocardial infarction) (HCC)    Pulmonary embolism (HCC)    Sleep apnea    Past Surgical History:  Procedure Laterality Date   APPENDECTOMY     CARDIAC STENTS     X2   COLONOSCOPY WITH PROPOFOL N/A 08/09/2021   Procedure: COLONOSCOPY WITH PROPOFOL;  Surgeon: Wyline Mood, MD;  Location: [facility] ENDOSCOPY;  Service: Gastroenterology;  Laterality: N/A;   IVC FILTER INSERTION     TONSILLECTOMY     Family History  Problem Relation Age of Onset   Diabetes Mother    Liver cancer Sister    Social History   Socioeconomic History   Marital status: Married    Spouse name: Not on file   Number of children: Not on file   Years of education: Not on file   Highest education level: Not on file  Occupational History   Not on file  Tobacco Use   Smoking status: Never   Smokeless tobacco: Never  Vaping Use   Vaping status: Never Used  Substance and Sexual Activity   Alcohol use: Not Currently   Drug use: Never   Sexual activity: Yes  Other Topics Concern   Not on file  Social History Narrative   Not on file   Social Determinants of  Health   Financial Resource Strain: Not on file  Food Insecurity: No Food Insecurity (06/29/2023)   Hunger Vital Sign    Worried About Running Out of Food in the Last Year: Never true    Ran Out of Food in the Last Year: Never true  Transportation Needs: No Transportation Needs (06/29/2023)   PRAPARE - Administrator, Civil Service (Medical): No    Lack of Transportation (Non-Medical): No  Physical Activity: Not on file  Stress: Not on file  Social Connections: Not on file   No Known Allergies  Medications   (Not in a hospital admission)    No current facility-administered medications for this encounter.  Current Outpatient Medications:    meclizine (ANTIVERT) 25 MG tablet, Take 1 tablet (25 mg total) by mouth 3 (three) times daily as needed for dizziness., Disp: 30 tablet, Rfl: 0   ondansetron (ZOFRAN-ODT) 8 MG disintegrating tablet, Take 1 tablet (8 mg total) by mouth every 8 (eight) hours as needed for nausea or vomiting., Disp: 20 tablet, Rfl: 0   albuterol (VENTOLIN HFA) 108 (90 Base) MCG/ACT inhaler, Inhale 2 puffs into the lungs every 4 (four) hours as needed., Disp: 8 g, Rfl:  0   apixaban (ELIQUIS) 5 MG TABS tablet, Take 1 tablet (5 mg total) by mouth 2 (two) times daily., Disp: 60 tablet, Rfl: 12   atorvastatin (LIPITOR) 20 MG tablet, Take 1 tablet (20 mg total) by mouth at bedtime., Disp: 90 tablet, Rfl: 3   carvedilol (COREG) 3.125 MG tablet, Take 1 tablet (3.125 mg total) by mouth 2 (two) times daily with a meal., Disp: 180 tablet, Rfl: 0   diclofenac Sodium (VOLTAREN) 1 % GEL, Apply 4 g topically 4 (four) times daily. To affected joint as needed for pain, Disp: 100 g, Rfl: 0   furosemide (LASIX) 20 MG tablet, Take 1 tablet (20 mg total) by mouth ONCE OR TWICE daily (total daily dose maximum 40 mg) as needed for up to 3 days for increased leg swelling, weight gain 5+ lbs over 1-2 days. Seek medical care if these symptoms are not improving with increased dose.,  Disp: , Rfl:    ipratropium (ATROVENT) 0.06 % nasal spray, Place 2 sprays into both nostrils 4 (four) times daily as needed for rhinitis., Disp: 15 mL, Rfl: 5   latanoprost (XALATAN) 0.005 % ophthalmic solution, Place 1 drop into both eyes at bedtime., Disp: , Rfl:    pantoprazole (PROTONIX) 40 MG tablet, Take 1 tablet (40 mg total) by mouth daily before breakfast., Disp: 90 tablet, Rfl: 3  Vitals   Vitals:   08/28/23 1500 08/28/23 1530 08/28/23 1559 08/28/23 1730  BP: (!) 138/93 (!) 142/93  132/86  Pulse: 65 72  63  Resp: 19 20  20   Temp:   97.9 F (36.6 C)   TempSrc:   Oral   SpO2: 96% 94%  99%  Weight:      Height:         Body mass index is 38.84 kg/m.  Physical Exam   Physical Exam Gen: A&O x4, NAD HEENT: Atraumatic, normocephalic;mucous membranes moist; oropharynx clear, tongue without atrophy or fasciculations. Neck: Supple, trachea midline. Resp: CTAB, no w/r/r CV: RRR, no m/g/r; nml S1 and S2. 2+ symmetric peripheral pulses. Abd: soft/NT/ND; nabs x 4 quad Extrem: Nml bulk; no cyanosis, clubbing, or edema.  Neuro: *MS: A&O x4. Follows multi-step commands.  *Speech: fluid, nondysarthric, able to name and repeat *CN:    I: Deferred   II,III: PERRLA, VFF by confrontation, optic discs unable to be visualized 2/2 pupillary constriction   III,IV,VI: EOMI w/o nystagmus, no ptosis   V: Sensation intact from V1 to V3 to LT   VII: Eyelid closure was full.  Smile symmetric.   VIII: Hearing intact to voice   IX,X: Voice normal, palate elevates symmetrically    XI: SCM/trap 5/5 bilat   XII: Tongue protrudes midline, no atrophy or fasciculations   *Motor:   Normal bulk.  No tremor, rigidity or bradykinesia. No pronator drift.    Strength: Dlt Bic Tri WrE WrF FgS Gr HF KnF KnE PlF DoF    Left 5 5 5 5 5 5 5 5 5 5 5 5     Right 5 5 5 5 5 5 5 5 5 5 5 5     *Sensory: Intact to light touch, pinprick, temperature vibration throughout. Symmetric. Propioception intact bilat.  No  double-simultaneous extinction.  *Coordination:  Finger-to-nose, heel-to-shin, rapid alternating motions were intact. *Reflexes:  2+ and symmetric throughout without clonus; toes down-going bilat *Gait: deferred  NIHSS = 0   Premorbid mRS = 0   Labs   CBC:  Recent Labs  Lab 08/28/23 1207  WBC 8.1  HGB 14.4  HCT 41.9  MCV 84.6  PLT 202    Basic Metabolic Panel:  Lab Results  Component Value Date   NA 138 08/28/2023   K 3.8 08/28/2023   CO2 22 08/28/2023   GLUCOSE 109 (H) 08/28/2023   BUN 19 08/28/2023   CREATININE 1.31 (H) 08/28/2023   CALCIUM 9.3 08/28/2023   GFRNONAA >60 08/28/2023   GFRAA >60 06/17/2020   Lipid Panel:  Lab Results  Component Value Date   LDLCALC 159 (H) 09/27/2022   HgbA1c:  Lab Results  Component Value Date   HGBA1C 5.5 09/27/2022   Urine Drug Screen: No results found for: "LABOPIA", "COCAINSCRNUR", "LABBENZ", "AMPHETMU", "THCU", "LABBARB"  Alcohol Level No results found for: "ETH"   Impression   This is a 57 yo man with pmhx significant for DVT/PE on eliquis, HL, MI who presents with severe vertigo and n/v. Acute onset concerning for possible posterior circulation stroke. Patient was not eligible for TNK bc he takes eliquis. No LVO on CTA.  Recommendations   - MRI brain wo contrast. If e/o acute infarct, recommend admission for stroke workup. If negative, no further neurologic workup indicated ______________________________________________________________________   Thank you for the opportunity to take part in the care of this patient. If you have any further questions, please contact the neurology consultation attending.  Signed,  Bing Neighbors, MD [facility] [phone]  If 7pm- 7am, please page neurology on call as listed in AMION.  **Any copied and pasted documentation in this note was written by me in another application not billed for and pasted by me into this document.

## 2023-09-14 ENCOUNTER — Emergency Department
Admission: EM | Admit: 2023-09-14 | Discharge: 2023-09-14 | Disposition: A | Discharge status: Home / Self Care | Payer: Federal, State, Local not specified - PPO | Attending: Emergency Medicine | Admitting: Emergency Medicine

## 2023-09-14 ENCOUNTER — Other Ambulatory Visit: Payer: Self-pay

## 2023-09-14 ENCOUNTER — Encounter: Payer: Self-pay | Admitting: Emergency Medicine

## 2023-09-14 DIAGNOSIS — R040 Epistaxis: Secondary | ICD-10-CM | POA: Diagnosis present

## 2023-09-14 DIAGNOSIS — Z7901 Long term (current) use of anticoagulants: Secondary | ICD-10-CM | POA: Diagnosis not present

## 2023-09-14 MED ORDER — OXYMETAZOLINE HCL 0.05 % NA SOLN
1.0000 | Freq: Once | NASAL | Status: AC
  Administered 2023-09-14: 1 via NASAL
  Filled 2023-09-14: qty 30

## 2023-09-14 NOTE — ED Triage Notes (Signed)
Patient ambulatory to triage with complaints of nose bleed x2.5 hours. Patient is on eliquis. Denies previous hx of this, now states he has a headache and his stomach is upset. Patient's nose has appeared to slow down dripping but still states he feels it down his throat.

## 2023-09-14 NOTE — ED Notes (Signed)
Patient discharged at this time. Ambulated to lobby with independent and steady gait. Breathing unlabored speaking in full sentences. Verbalized understanding of all discharge, follow up, and medication teaching. Discharged homed with all belongings.   This RN discharged pt for primary RN

## 2023-09-14 NOTE — ED Notes (Signed)
Afrin administered and nasal clamp applied. Ice pack applied to back of neck to help with bleeding.

## 2023-09-14 NOTE — ED Provider Notes (Signed)
   [facility] Provider Note    Event Date/Time   First MD Initiated Contact with Patient 09/14/23 0350     (approximate)   History   Epistaxis   HPI  Benjamin Delacruz is a 57 y.o. male with a history of pulmonary embolism on Eliquis who comes the ED due to nosebleed.  He has had bleeding off and on twice tonight.  Denies chest pain shortness of breath fever or headache, no trauma.     Physical Exam   Triage Vital Signs: ED Triage Vitals  Encounter Vitals Group     BP 09/14/23 0057 (!) 153/105     Systolic BP Percentile --      Diastolic BP Percentile --      Pulse Rate 09/14/23 0057 93     Resp 09/14/23 0057 18     Temp 09/14/23 0057 98.5 F (36.9 C)     Temp src --      SpO2 09/14/23 0057 98 %     Weight 09/14/23 0059 248 lb 0.3 oz (112.5 kg)     Height 09/14/23 0059 5\' 7"  (1.702 m)     Head Circumference --      Peak Flow --      Pain Score 09/14/23 0059 0     Pain Loc --      Pain Education --      Exclude from Growth Chart --     Most recent vital signs: Vitals:   09/14/23 0057  BP: (!) 153/105  Pulse: 93  Resp: 18  Temp: 98.5 F (36.9 C)  SpO2: 98%    General: Awake, no distress.  CV:  Good peripheral perfusion.  Resp:  Normal effort.  Abd:  No distention.  Other:  No blood in the oropharynx.  Clamp removed, noting stigmata of recent bleeding at the right nare.  Left side normal.  No active bleeding.   ED Results / Procedures / Treatments   Labs (all labs ordered are listed, but only abnormal results are displayed) Labs Reviewed - No data to display   RADIOLOGY    PROCEDURES:  Procedures   MEDICATIONS ORDERED IN ED: Medications  oxymetazoline (AFRIN) 0.05 % nasal spray 1 spray (1 spray Each Nare Given 09/14/23 0104)     IMPRESSION / MDM / ASSESSMENT AND PLAN / ED COURSE  I reviewed the triage vital signs and the nursing notes.                             Patient presents with anterior epistaxis, complicated  by Eliquis use  Clinical Course as of 09/14/23 0608  [facility] Sep 14, 2023  0453 Clamp removed, no bleeding currently. Stigmata of recent bleeding in right external nare. [PS]  0548 Remains hemostatic. Stable for DC.  [PS]    Clinical Course User Index [PS] Sharman Cheek, MD     FINAL CLINICAL IMPRESSION(S) / ED DIAGNOSES   Final diagnoses:  Anterior epistaxis     Rx / DC Orders   ED Discharge Orders     None        Note:  This document was prepared using Dragon voice recognition software and may include unintentional dictation errors.   Sharman Cheek, MD 09/14/23 [phone]

## 2023-10-22 ENCOUNTER — Emergency Department
Admission: EM | Admit: 2023-10-22 | Discharge: 2023-10-22 | Disposition: A | Discharge status: Home / Self Care | Payer: Federal, State, Local not specified - PPO | Attending: Emergency Medicine | Admitting: Emergency Medicine

## 2023-10-22 ENCOUNTER — Emergency Department: Payer: Federal, State, Local not specified - PPO

## 2023-10-22 ENCOUNTER — Other Ambulatory Visit: Payer: Self-pay

## 2023-10-22 DIAGNOSIS — R0789 Other chest pain: Secondary | ICD-10-CM | POA: Diagnosis not present

## 2023-10-22 DIAGNOSIS — R079 Chest pain, unspecified: Secondary | ICD-10-CM | POA: Diagnosis present

## 2023-10-22 LAB — CBC
HCT: 41.2 % (ref 39.0–52.0)
Hemoglobin: 13.9 g/dL (ref 13.0–17.0)
MCH: 29 pg (ref 26.0–34.0)
MCHC: 33.7 g/dL (ref 30.0–36.0)
MCV: 85.8 fL (ref 80.0–100.0)
Platelets: 210 10*3/uL (ref 150–400)
RBC: 4.8 MIL/uL (ref 4.22–5.81)
RDW: 13.2 % (ref 11.5–15.5)
WBC: 7.4 10*3/uL (ref 4.0–10.5)
nRBC: 0 % (ref 0.0–0.2)

## 2023-10-22 LAB — BASIC METABOLIC PANEL
Anion gap: 8 (ref 5–15)
BUN: 13 mg/dL (ref 6–20)
CO2: 22 mmol/L (ref 22–32)
Calcium: 9 mg/dL (ref 8.9–10.3)
Chloride: 108 mmol/L (ref 98–111)
Creatinine, Ser: 1.19 mg/dL (ref 0.61–1.24)
GFR, Estimated: >60 mL/min (ref >60)
Glucose, Bld: 124 mg/dL — ABNORMAL HIGH (ref 70–99)
Potassium: 3.9 mmol/L (ref 3.5–5.1)
Sodium: 138 mmol/L (ref 135–145)

## 2023-10-22 LAB — TROPONIN I (HIGH SENSITIVITY)
Troponin I (High Sensitivity): 5 ng/L (ref <18)
Troponin I (High Sensitivity): 4 ng/L (ref <18)

## 2023-10-22 LAB — D-DIMER, QUANTITATIVE: D-Dimer, Quant: 0.53 ug{FEU}/mL — ABNORMAL HIGH (ref 0.00–0.50)

## 2023-10-22 MED ORDER — IOHEXOL 350 MG/ML SOLN
75.0000 mL | Freq: Once | INTRAVENOUS | Status: AC | PRN
  Administered 2023-10-22: 75 mL via INTRAVENOUS

## 2023-10-22 NOTE — ED Notes (Signed)
Called for room x 1. No answer, pt not seen in the department

## 2023-10-22 NOTE — ED Triage Notes (Signed)
Pt to ED via POV with c/o R sided chest pain with inspiration. Pt states pain ongoing x 1 week. Pt states he was awoken from sleep. States he suffers from blood clots and this feels similar. Pt A&O x4 ambulatory at this time.

## 2023-10-22 NOTE — ED Provider Notes (Signed)
-----------------------------------------   3:00 PM on 10/22/2023 -----------------------------------------  Blood pressure (!) 141/84, pulse (!) 57, temperature 98 F (36.7 C), temperature source Oral, resp. rate 18, height 5\' 7"  (1.702 m), weight 112.5 kg, SpO2 100%.  Assuming care from Dr. Derrill Kay.  In short, Benjamin Delacruz is a 57 y.o. male with a chief complaint of Chest Pain .  Refer to the original H&P for additional details.  The current plan of care is to follow-up CTA chest.  ----------------------------------------- 4:38 PM on 10/22/2023 ----------------------------------------- CTA chest is negative for PE or other acute process, given reassuring workup patient is appropriate for discharge home with outpatient follow-up.  He was counseled to return to the ED for new or worsening symptoms.  Patient agrees with plan.   Chesley Noon, MD 10/22/23 [phone]

## 2023-10-22 NOTE — ED Provider Notes (Signed)
[facility] Provider Note    Event Date/Time   First MD Initiated Contact with Patient 10/22/23 1206     (approximate)   History   Chest Pain   HPI  Benjamin Delacruz is a 57 y.o. male who presents to the emergency department today because of concerns for right sided chest pain.  He describes that his locating across his lower right chest.  The pain started roughly 1 week ago.  It is worse with deep breaths.  Came in today because it made it hard for him to sleep last night.  Does have a history of blood clots and this reminds him of his previous blood clots.  He is taking Eliquis and denies missing any recent doses. Denies any fevers.      Physical Exam   Triage Vital Signs: ED Triage Vitals  Encounter Vitals Group     BP 10/22/23 0410 (!) 153/90     Systolic BP Percentile --      Diastolic BP Percentile --      Pulse Rate 10/22/23 0410 75     Resp 10/22/23 0410 18     Temp 10/22/23 0410 98.3 F (36.8 C)     Temp Source 10/22/23 0716 Oral     SpO2 10/22/23 0410 100 %     Weight 10/22/23 0406 248 lb 0.3 oz (112.5 kg)     Height 10/22/23 0406 5\' 7"  (1.702 m)     Head Circumference --      Peak Flow --      Pain Score 10/22/23 0406 9     Pain Loc --      Pain Education --      Exclude from Growth Chart --     Most recent vital signs: Vitals:   10/22/23 0410 10/22/23 0716  BP: (!) 153/90 (!) 166/97  Pulse: 75 64  Resp: 18 18  Temp: 98.3 F (36.8 C) 97.8 F (36.6 C)  SpO2: 100% 100%   General: Awake, alert, oriented. CV:  Good peripheral perfusion. Regular rate and rhythm. Resp:  Normal effort. Lungs clear. Abd:  No distention.    ED Results / Procedures / Treatments   Labs (all labs ordered are listed, but only abnormal results are displayed) Labs Reviewed  BASIC METABOLIC PANEL - Abnormal; Notable for the following components:      Result Value   Glucose, Bld 124 (*)    All other components within normal limits  D-DIMER,  QUANTITATIVE - Abnormal; Notable for the following components:   D-Dimer, Quant 0.53 (*)    All other components within normal limits  CBC  TROPONIN I (HIGH SENSITIVITY)  TROPONIN I (HIGH SENSITIVITY)     EKG  I, Phineas Semen, attending physician, personally viewed and interpreted this EKG  EKG Time: 0407 Rate: 72 Rhythm: sinus rhythm Axis: left axis deviation Intervals: qtc 429 QRS: narrow, q waves v1 ST changes: no st elevation Impression: abnormal ekg    RADIOLOGY I independently interpreted and visualized the CXR. My interpretation: No pneumonia Radiology interpretation:  IMPRESSION:  No active cardiopulmonary disease.      PROCEDURES:  Critical Care performed: No    MEDICATIONS ORDERED IN ED: Medications - No data to display   IMPRESSION / MDM / ASSESSMENT AND PLAN / ED COURSE  I reviewed the triage vital signs and the nursing notes.  Differential diagnosis includes, but is not limited to, pneumonia, PE, ACS  Patient's presentation is most consistent with acute presentation with potential threat to life or bodily function.  Patient presented to the emergency department today because of concerns for right lower chest pain that was reminiscent of his PE.  On exam patient lungs are clear.  He is not tachypneic.  Blood work with negative troponin although slightly elevated D-dimer.  Because of this a CT angio was ordered.  Awaiting CT angio at time of signout.     FINAL CLINICAL IMPRESSION(S) / ED DIAGNOSES   Right sided chest pain    Note:  This document was prepared using Dragon voice recognition software and may include unintentional dictation errors.    Phineas Semen, MD 10/22/23 [phone]

## 2023-10-22 NOTE — ED Notes (Signed)
ED Provider at bedside. 

## 2023-11-20 ENCOUNTER — Other Ambulatory Visit: Payer: Self-pay | Admitting: Family Medicine

## 2023-11-20 DIAGNOSIS — I10 Essential (primary) hypertension: Secondary | ICD-10-CM

## 2023-11-21 NOTE — Telephone Encounter (Signed)
 Requested Prescriptions  Pending Prescriptions Disp Refills   carvedilol  (COREG ) 3.125 MG tablet [Pharmacy Med Name: CARVEDILOL  3.125MG  TABLETS] 180 tablet 0    Sig: TAKE 1 TABLET(3.125 MG) BY MOUTH TWICE DAILY WITH A MEAL     Cardiovascular: Beta Blockers 3 Failed - 11/21/2023 12:46 PM      Failed - Last BP in normal range    BP Readings from Last 1 Encounters:  10/22/23 (!) 146/84         Passed - Cr in normal range and within 360 days    Creat  Date Value Ref Range Status  09/27/2022 1.19 0.70 - 1.30 mg/dL Final   Creatinine, Ser  Date Value Ref Range Status  10/22/2023 1.19 0.61 - 1.24 mg/dL Final         Passed - AST in normal range and within 360 days    AST  Date Value Ref Range Status  06/28/2023 23 15 - 41 U/L Final         Passed - ALT in normal range and within 360 days    ALT  Date Value Ref Range Status  06/28/2023 13 0 - 44 U/L Final         Passed - Last Heart Rate in normal range    Pulse Readings from Last 1 Encounters:  10/22/23 (!) 58         Passed - Valid encounter within last 6 months    Recent Outpatient Visits           3 months ago Essential hypertension   [place] [facility] [place], Timoteo Force A, RPH-CPP   4 months ago Recurrent pulmonary emboli [facility])   [place] [facility] [place], Timoteo Force A, RPH-CPP   4 months ago Recurrent pulmonary emboli [facility])   [place] [facility] [place], Kayleen Party, DO   1 year ago Pure hypercholesterolemia   [place] [facility] Raina Bunting, DO   1 year ago Essential hypertension    [facility] [place], Kayleen Party, [place]

## 2023-12-05 ENCOUNTER — Encounter: Payer: Self-pay | Admitting: Emergency Medicine

## 2023-12-05 ENCOUNTER — Ambulatory Visit
Admission: EM | Admit: 2023-12-05 | Discharge: 2023-12-05 | Disposition: A | Discharge status: Home / Self Care | Payer: Federal, State, Local not specified - PPO | Attending: Emergency Medicine | Admitting: Emergency Medicine

## 2023-12-05 DIAGNOSIS — J0100 Acute maxillary sinusitis, unspecified: Secondary | ICD-10-CM | POA: Diagnosis not present

## 2023-12-05 MED ORDER — IPRATROPIUM BROMIDE 0.06 % NA SOLN: 2.0000 | Freq: Four times a day (QID) | NASAL | 12 refills | Status: DC

## 2023-12-05 MED ORDER — AMOXICILLIN-POT CLAVULANATE 875-125 MG PO TABS: 1.0000 | ORAL_TABLET | Freq: Two times a day (BID) | ORAL | 0 refills | Status: AC

## 2023-12-05 NOTE — Discharge Instructions (Addendum)
The Augmentin twice daily with food for 10 days for treatment of your sinusitis.  Use the Atrovent nasal spray to help with nasal congestion.  Instill 2 squirts in each nostril every 6 hours as needed.  Perform sinus irrigation 2-3 times a day with a [employer]Med sinus rinse kit and distilled water.  Do not use tap water.  You can use plain over-the-counter Mucinex every 6 hours to break up the stickiness of the mucus so your body can clear it.  Increase your oral fluid intake to thin out your mucus so that is also able for your body to clear more easily.  Take an over-the-counter probiotic, such as Culturelle-align-activia, 1 hour after each dose of antibiotic to prevent diarrhea.  Continue to monitor your blood pressure at home if it remains elevated make an appointment to speak with your primary care provider.  If you develop any changes in vision, worsening headache, forceful nausea or vomiting, chest pain, or shortness of breath please call 911 and go to the ER for evaluation.  If you develop any new or worsening symptoms return for reevaluation or see your primary care provider.

## 2023-12-05 NOTE — ED Triage Notes (Signed)
Patient presents with c/o nasal congestion and headache x 3 days. Patient states he has been using tylenol and ibuprofen but it is not effective.

## 2023-12-05 NOTE — ED Provider Notes (Signed)
[facility]    CSN: [id number] Arrival date & time: 12/05/23  1320      History   Chief Complaint Chief Complaint  Patient presents with   Headache   Nasal Congestion    HPI Shanard Treto is a 58 y.o. male.   HPI  58 year old male with past medical history significant for PE, MI, hypercholesterolemia, DVT, and sleep apnea presents for evaluation of sinus pain, nasal congestion, and headache that has been going on for last 3 days.  He states he has been experiencing bloody nasal discharge.  He denies any fever, ear pain, sore throat, cough, or GI symptoms.  Patient's blood pressure is elevated 158/100 at triage.  He indicates that he has taken his blood pressure medication.  He has been monitoring his blood pressure at home and it has been normal.  He is attributing the elevated blood pressure to his headache.  He denies any changes in vision, chest pain, or shortness of breath.  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 Authorizing Provider  amoxicillin-clavulanate (AUGMENTIN) 875-125 MG tablet Take 1 tablet by mouth every 12 (twelve) hours for 7 days. 12/05/23 12/12/23 Yes Becky Augusta, NP  ipratropium (ATROVENT) 0.06 % nasal spray Place 2 sprays into both nostrils 4 (four) times daily. 12/05/23  Yes Becky Augusta, NP  albuterol (VENTOLIN HFA) 108 (90 Base) MCG/ACT inhaler Inhale 2 puffs into the lungs every 4 (four) hours as needed. 07/06/23  Yes Dionne Bucy, MD  apixaban (ELIQUIS) 5 MG TABS tablet Take 1 tablet (5 mg total) by mouth 2 (two) times daily. 07/04/23  Yes Karamalegos, Netta Neat, DO  atorvastatin (LIPITOR) 20 MG tablet Take 1 tablet (20 mg total) by mouth at bedtime. 08/23/22  Yes Karamalegos, Netta Neat, DO  carvedilol (COREG) 3.125 MG tablet TAKE 1 TABLET(3.125 MG) BY MOUTH TWICE DAILY WITH A MEAL 11/21/23  Yes Karamalegos, Netta Neat, DO  diclofenac Sodium (VOLTAREN) 1 % GEL Apply 4 g topically 4 (four) times daily. To affected joint as needed for pain 06/30/23  Yes Sunnie Nielsen, DO  furosemide (LASIX) 20 MG tablet Take 1 tablet (20 mg total) by mouth ONCE OR TWICE daily (total daily dose maximum 40 mg) as needed for up to 3 days for increased leg swelling, weight gain 5+ lbs over 1-2 days. Seek medical care if these symptoms are not  improving with increased dose. 06/30/23  Yes Sunnie Nielsen, DO  latanoprost (XALATAN) 0.005 % ophthalmic solution Place 1 drop into both eyes at bedtime. 06/30/23  Yes [provider]  ondansetron (ZOFRAN-ODT) 8 MG disintegrating tablet Take 1 tablet (8 mg total) by mouth every 8 (eight) hours as needed for nausea or vomiting. 08/28/23  Yes Merwyn Katos, MD    Family History Family History  Problem Relation Age of Onset   Diabetes Mother    Liver cancer Sister     Social History Social History   Tobacco Use   Smoking status: Never   Smokeless tobacco: Never   Vaping Use   Vaping status: Never Used  Substance Use Topics   Alcohol use: Not Currently   Drug use: Never     Allergies   Patient has no known allergies.   Review of Systems Review of Systems  Constitutional:  Negative for fever.  HENT:  Positive for congestion, rhinorrhea and sinus pain. Negative for ear pain and sore throat.   Eyes:  Negative for visual disturbance.  Respiratory:  Negative for cough, shortness of breath and wheezing.   Cardiovascular:  Negative for chest pain.  Neurological:  Positive for headaches. Negative for dizziness.     Physical Exam Triage Vital Signs ED Triage Vitals  Encounter Vitals Group     BP 12/05/23 1337 (!) 158/100     Systolic BP Percentile --      Diastolic BP Percentile --      Pulse Rate 12/05/23 1337 65     Resp 12/05/23 1337 18     Temp 12/05/23 1337 97.9 F (36.6 C)     Temp Source 12/05/23 1337 Tympanic     SpO2 12/05/23 1337 99 %     Weight --      Height --      Head Circumference --      Peak Flow --      Pain Score 12/05/23 1338 8     Pain Loc --      Pain Education --      Exclude from Growth Chart --    No data found.  Updated Vital Signs BP (!) 158/100 (BP Location: Left Arm)   Pulse 65   Temp 97.9 F (36.6 C) (Tympanic)   Resp 18   SpO2 99%   Visual Acuity Right Eye Distance:   Left Eye Distance:   Bilateral Distance:    Right Eye Near:   Left Eye Near:    Bilateral Near:     Physical Exam Vitals and nursing note reviewed.  Constitutional:      Appearance: Normal appearance. He is not ill-appearing.  HENT:     Head: Normocephalic and atraumatic.     Right Ear: Tympanic membrane, ear canal and external ear normal. There is no impacted cerumen.     Left Ear: Tympanic membrane, ear canal and external ear normal. There is no impacted cerumen.     Nose: Congestion and rhinorrhea present.     Comments: Nasal mucosa is markedly edematous with bloody discharge in both nares.  Patient has marked  tenderness to compression of bilateral maxillary sinuses.    Mouth/Throat:     Mouth: Mucous membranes are moist.     Pharynx: Oropharynx is clear. No oropharyngeal exudate or posterior oropharyngeal erythema.  Cardiovascular:     Rate and Rhythm: Normal rate and regular rhythm.     Pulses: Normal pulses.     Heart sounds: Normal  heart sounds. No murmur heard.    No friction rub. No gallop.  Pulmonary:     Effort: Pulmonary effort is normal.     Breath sounds: Normal breath sounds. No wheezing, rhonchi or rales.  Musculoskeletal:     Cervical back: Normal range of motion and neck supple. No tenderness.  Lymphadenopathy:     Cervical: No cervical adenopathy.  Skin:    General: Skin is warm and dry.     Capillary Refill: Capillary refill takes less than 2 seconds.     Findings: No rash.  Neurological:     General: No focal deficit present.     Mental Status: He is alert and oriented to person, place, and time.      UC Treatments / Results  Labs (all labs ordered are listed, but only abnormal results are displayed) Labs Reviewed - No data to display  EKG   Radiology No results found.  Procedures Procedures (including critical care time)  Medications Ordered in UC Medications - No data to display  Initial Impression / Assessment and Plan / UC Course  I have reviewed the triage vital signs and the nursing notes.  Pertinent labs & imaging results that were available during my care of the patient were reviewed by me and considered in my medical decision making (see chart for details).   Patient is a pleasant, nontoxic-appearing 58 year old gentleman presenting with sinus complaints as outlined in HPI above.  His physical exam does reveal marked inflammation of his nasal mucosa with bloody discharge in both nares as well as tenderness to compression of bilateral maxillary sinuses.  Patient's pupils are equal round reactive and EOMs intact.  Cardiopulmonary dam reveals S1-S2  heart sounds with regular rate and rhythm lung sounds are close patient fields.  I will discharge patient home with diagnosis of acute maxillary sinusitis on Augmentin 875 twice daily for 10 days.  Also Atrovent nasal spray to help with nasal congestion.  We discussed doing sinus irrigation to help alleviate his mucus burden.  He should continue to monitor his blood pressure at home and if she develops any changes in vision, worsening headache, chest pain, shortness of breath he should go to the ER for evaluation.   Final Clinical Impressions(s) / UC Diagnoses   Final diagnoses:  Acute non-recurrent maxillary sinusitis     Discharge Instructions      The Augmentin twice daily with food for 10 days for treatment of your sinusitis.  Use the Atrovent nasal spray to help with nasal congestion.  Instill 2 squirts in each nostril every 6 hours as needed.  Perform sinus irrigation 2-3 times a day with a NeilMed sinus rinse kit and distilled water.  Do not use tap water.  You can use plain over-the-counter Mucinex every 6 hours to break up the stickiness of the mucus so your body can clear it.  Increase your oral fluid intake to thin out your mucus so that is also able for your body to clear more easily.  Take an over-the-counter probiotic, such as Culturelle-align-activia, 1 hour after each dose of antibiotic to prevent diarrhea.  Continue to monitor your blood pressure at home if it remains elevated make an appointment to speak with your primary care provider.  If you develop any changes in vision, worsening headache, forceful nausea or vomiting, chest pain, or shortness of breath please call 911 and go to the ER for evaluation.  If you develop any new or worsening symptoms return for reevaluation or  see your primary care provider.      ED Prescriptions     Medication Sig Dispense Auth. Provider   amoxicillin-clavulanate (AUGMENTIN) 875-125 MG tablet Take 1 tablet by mouth every 12  (twelve) hours for 7 days. 14 tablet Becky Augusta, NP   ipratropium (ATROVENT) 0.06 % nasal spray Place 2 sprays into both nostrils 4 (four) times daily. 15 mL Becky Augusta, NP      PDMP not reviewed this encounter.   Becky Augusta, NP 12/05/23 1420

## 2024-02-18 ENCOUNTER — Emergency Department: Payer: No Typology Code available for payment source

## 2024-02-18 ENCOUNTER — Other Ambulatory Visit: Payer: Self-pay

## 2024-02-18 ENCOUNTER — Emergency Department
Admission: EM | Admit: 2024-02-18 | Discharge: 2024-02-18 | Disposition: A | Discharge status: Home / Self Care | Payer: No Typology Code available for payment source | Attending: Emergency Medicine | Admitting: Emergency Medicine

## 2024-02-18 DIAGNOSIS — R2241 Localized swelling, mass and lump, right lower limb: Secondary | ICD-10-CM | POA: Insufficient documentation

## 2024-02-18 DIAGNOSIS — Z7901 Long term (current) use of anticoagulants: Secondary | ICD-10-CM | POA: Diagnosis not present

## 2024-02-18 DIAGNOSIS — M7989 Other specified soft tissue disorders: Secondary | ICD-10-CM

## 2024-02-18 DIAGNOSIS — R0781 Pleurodynia: Secondary | ICD-10-CM | POA: Diagnosis present

## 2024-02-18 LAB — BASIC METABOLIC PANEL WITH GFR
Anion gap: 8 (ref 5–15)
BUN: 14 mg/dL (ref 6–20)
CO2: 24 mmol/L (ref 22–32)
Calcium: 9.5 mg/dL (ref 8.9–10.3)
Chloride: 108 mmol/L (ref 98–111)
Creatinine, Ser: 1.15 mg/dL (ref 0.61–1.24)
GFR, Estimated: >60 mL/min (ref >60)
Glucose, Bld: 93 mg/dL (ref 70–99)
Potassium: 3.9 mmol/L (ref 3.5–5.1)
Sodium: 140 mmol/L (ref 135–145)

## 2024-02-18 LAB — CBC
HCT: 44.1 % (ref 39.0–52.0)
Hemoglobin: 15 g/dL (ref 13.0–17.0)
MCH: 28.8 pg (ref 26.0–34.0)
MCHC: 34 g/dL (ref 30.0–36.0)
MCV: 84.8 fL (ref 80.0–100.0)
Platelets: 193 10*3/uL (ref 150–400)
RBC: 5.2 MIL/uL (ref 4.22–5.81)
RDW: 12.8 % (ref 11.5–15.5)
WBC: 7.5 10*3/uL (ref 4.0–10.5)
nRBC: 0 % (ref 0.0–0.2)

## 2024-02-18 LAB — TROPONIN I (HIGH SENSITIVITY)
Troponin I (High Sensitivity): 5 ng/L (ref <18)
Troponin I (High Sensitivity): 5 ng/L (ref <18)

## 2024-02-18 LAB — D-DIMER, QUANTITATIVE: D-Dimer, Quant: 0.39 ug{FEU}/mL (ref 0.00–0.50)

## 2024-02-18 LAB — BRAIN NATRIURETIC PEPTIDE: B Natriuretic Peptide: 33 pg/mL (ref 0.0–100.0)

## 2024-02-18 MED ORDER — IOHEXOL 350 MG/ML SOLN
75.0000 mL | Freq: Once | INTRAVENOUS | Status: AC | PRN
Start: 2024-02-18 — End: 2024-02-18
  Administered 2024-02-18: 75 mL via INTRAVENOUS

## 2024-02-18 NOTE — ED Provider Notes (Signed)
.-----------------------------------------   3:25 PM on 02/18/2024 -----------------------------------------  Blood pressure (!) 168/107, pulse 60, temperature 98 F (36.7 C), temperature source Oral, resp. rate 17, height 5\' 7"  (1.702 m), weight 108.9 kg, SpO2 100%.  Assuming care from Dr. Peggi Bowels.  In short, Benjamin Delacruz is a 58 y.o. male with a chief complaint of Chest Pain .  Refer to the original H&P for additional details.  The current plan of care is to follow up CT PE.  Independent review of labs and imaging are below.  On reassessment patient states that chest pain has improved.  Discussed with him about imaging or lab results, will have him follow-up with his primary care doctor for further management outpatient.  Shared decision making to patient and he is agreeable to plan for discharge.  Considered but no indication for inpatient admission at this time, he is safe for outpatient management.  Strict return precautions given.  Discharged.  Clinical Course as of 02/18/24 1656  Mon Feb 18, 2024  1626 US  Venous Img Lower Unilateral Right Negative.  [TT]  1626 CT Angio Chest PE W and/or Wo Contrast IMPRESSION: 1. No embolism to the proximal subsegmental pulmonary artery level. No lung mass, consolidation, pleural effusion or pneumothorax. 2. Multiple other nonacute observations, as described above.   [TT]  1647 Independent review of labs, troponin is negative, BNP is not elevated, D-dimer is not elevated, no leukocytosis, electrolytes not severely deranged, creatinine is normal. [TT]    Clinical Course User Index [TT] Drenda Gentle, Richard Champion, MD      Shane Darling, MD 02/18/24 [phone]

## 2024-02-18 NOTE — Discharge Instructions (Signed)
 Please take your medications as prescribed, do not miss any doses especially of blood thinners.

## 2024-02-18 NOTE — ED Provider Notes (Signed)
 [facility] Provider Note    Event Date/Time   First MD Initiated Contact with Patient 02/18/24 1253     (approximate)   History   Chest Pain   HPI  Benjamin Delacruz is a 58 y.o. male with a history of blood clots on Eliquis who comes in with concerns for shortness of breath.  Patient reports pleuritic chest pain on the right side worse with taking a deep breath.  He denies missing Eliquis.  Patient reports that then he noticed his right leg get more swollen.  He denies that this right leg is typically more swollen than the left.  He reports being compliant with his Eliquis.   Physical Exam   Triage Vital Signs: ED Triage Vitals  Encounter Vitals Group     BP 02/18/24 1019 (!) 168/107     Systolic BP Percentile --      Diastolic BP Percentile --      Pulse Rate 02/18/24 1019 60     Resp 02/18/24 1019 17     Temp 02/18/24 1019 98 F (36.7 C)     Temp Source 02/18/24 1019 Oral     SpO2 02/18/24 1019 100 %     Weight 02/18/24 1020 240 lb (108.9 kg)     Height 02/18/24 1020 5\' 7"  (1.702 m)     Head Circumference --      Peak Flow --      Pain Score 02/18/24 1020 9     Pain Loc --      Pain Education --      Exclude from Growth Chart --     Most recent vital signs: Vitals:   02/18/24 1019  BP: (!) 168/107  Pulse: 60  Resp: 17  Temp: 98 F (36.7 C)  SpO2: 100%     General: Awake, no distress.  CV:  Good peripheral perfusion.  Resp:  Normal effort.  Clear lungs Abd:  No distention.  Soft nontender Other:  Right leg with some edema noted in it and some increased swelling.   ED Results / Procedures / Treatments   Labs (all labs ordered are listed, but only abnormal results are displayed) Labs Reviewed  BASIC METABOLIC PANEL WITH GFR  CBC  D-DIMER, QUANTITATIVE  TROPONIN I (HIGH SENSITIVITY)  TROPONIN I (HIGH SENSITIVITY)     EKG  My interpretation of EKG:  Normal sinus rhythm 60 without any ST elevation, T wave inversions in  the inferior lateral leads, normal intervals  RADIOLOGY I have reviewed the xray personally and interpreted no evidence of any pneumonia   PROCEDURES:  Critical Care performed: No  Procedures   MEDICATIONS ORDERED IN ED: Medications - No data to display   IMPRESSION / MDM / ASSESSMENT AND PLAN / ED COURSE  I reviewed the triage vital signs and the nursing notes.   Patient's presentation is most consistent with acute presentation with potential threat to life or bodily function.   Patient comes in with shortness of breath with pleuritic chest pain elevated Wells score D-dimer negative but given elevated Wells score and the high risk area we discussed that this testing can sometimes miss blood clots.  Patient stated that he would prefer to rule out blood clots with CT, ultrasound and given he does report significant change in leg swelling on the right leg.  Troponin is negative.  BNP is normal.  BMP is reassuring CBC reassuring D-dimer negative  Discussed with patient his reassuring D-dimer however patient is  adamant that his leg is swollen significantly over the past few days and would like to proceed with ultrasound, CT imaging given elevated Wells score  The patient is on the cardiac monitor to evaluate for evidence of arrhythmia and/or significant heart rate changes.      FINAL CLINICAL IMPRESSION(S) / ED DIAGNOSES   Final diagnoses:  Pleuritic chest pain  Right leg swelling     Rx / DC Orders   ED Discharge Orders     None        Note:  This document was prepared using Dragon voice recognition software and may include unintentional dictation errors.   Lubertha Rush, MD 02/18/24 [phone]

## 2024-02-18 NOTE — ED Triage Notes (Signed)
 Pt states CP/pain on inspiration, pt states HX of blood clots. Pt states he is currently taking Eliquis. NAD noted. Pt states HX of PE's.

## 2024-02-18 NOTE — ED Notes (Signed)
Lt green, lav, and blue top sent to lab

## 2024-03-14 ENCOUNTER — Emergency Department
Admission: EM | Admit: 2024-03-14 | Discharge: 2024-03-14 | Disposition: A | Discharge status: Home / Self Care | Attending: Emergency Medicine | Admitting: Emergency Medicine

## 2024-03-14 ENCOUNTER — Emergency Department

## 2024-03-14 DIAGNOSIS — M549 Dorsalgia, unspecified: Secondary | ICD-10-CM

## 2024-03-14 DIAGNOSIS — R0602 Shortness of breath: Secondary | ICD-10-CM | POA: Diagnosis not present

## 2024-03-14 DIAGNOSIS — R0781 Pleurodynia: Secondary | ICD-10-CM | POA: Diagnosis not present

## 2024-03-14 DIAGNOSIS — M546 Pain in thoracic spine: Secondary | ICD-10-CM | POA: Diagnosis present

## 2024-03-14 DIAGNOSIS — Z7901 Long term (current) use of anticoagulants: Secondary | ICD-10-CM | POA: Insufficient documentation

## 2024-03-14 LAB — BASIC METABOLIC PANEL WITH GFR
Anion gap: 6 (ref 5–15)
BUN: 16 mg/dL (ref 6–20)
CO2: 22 mmol/L (ref 22–32)
Calcium: 9.6 mg/dL (ref 8.9–10.3)
Chloride: 108 mmol/L (ref 98–111)
Creatinine, Ser: 1.14 mg/dL (ref 0.61–1.24)
GFR, Estimated: >60 mL/min (ref >60)
Glucose, Bld: 147 mg/dL — ABNORMAL HIGH (ref 70–99)
Potassium: 3.9 mmol/L (ref 3.5–5.1)
Sodium: 136 mmol/L (ref 135–145)

## 2024-03-14 LAB — CBC
HCT: 42.3 % (ref 39.0–52.0)
Hemoglobin: 14.5 g/dL (ref 13.0–17.0)
MCH: 29.2 pg (ref 26.0–34.0)
MCHC: 34.3 g/dL (ref 30.0–36.0)
MCV: 85.1 fL (ref 80.0–100.0)
Platelets: 187 10*3/uL (ref 150–400)
RBC: 4.97 MIL/uL (ref 4.22–5.81)
RDW: 13.1 % (ref 11.5–15.5)
WBC: 7.3 10*3/uL (ref 4.0–10.5)
nRBC: 0 % (ref 0.0–0.2)

## 2024-03-14 LAB — D-DIMER, QUANTITATIVE: D-Dimer, Quant: 0.3 ug{FEU}/mL (ref 0.00–0.50)

## 2024-03-14 LAB — TROPONIN I (HIGH SENSITIVITY)
Troponin I (High Sensitivity): 4 ng/L (ref <18)
Troponin I (High Sensitivity): 4 ng/L (ref <18)

## 2024-03-14 MED ORDER — TRAMADOL HCL 50 MG PO TABS: 50.0000 mg | ORAL_TABLET | Freq: Four times a day (QID) | ORAL | 0 refills | Status: AC | PRN

## 2024-03-14 MED ORDER — KETOROLAC TROMETHAMINE 15 MG/ML IJ SOLN
15.0000 mg | Freq: Once | INTRAMUSCULAR | Status: AC
  Administered 2024-03-14: 15 mg via INTRAVENOUS
  Filled 2024-03-14: qty 1

## 2024-03-14 NOTE — ED Triage Notes (Signed)
 Pt to ED via POV from home. Pt ambulatory to triage. Pt reports upper back pain and CP accompanied by SOB x1 wk. Pt is on Eliquis  for prior blood clots.

## 2024-03-14 NOTE — Discharge Instructions (Signed)
 You may take the tramadol as needed for pain.  Follow-up with your primary care doctor.  In the meantime, return to the ER for new, worsening, persistent severe back or chest pain, difficulty breathing, weakness or lightheadedness, leg swelling, fever, or any other new or worsening symptoms that concern you.

## 2024-03-14 NOTE — ED Provider Notes (Signed)
 [facility] Provider Note    Event Date/Time   First MD Initiated Contact with Patient 03/14/24 [phone]     (approximate)   History   Back Pain and Shortness of Breath   HPI  Benjamin Delacruz is a 58 y.o. male with a history of pulmonary embolism, DVT, on Eliquis , hypercholesterolemia, and MI who presents with right upper back pain, persistent course for about a week, worsened since last night.  It is worse when he takes a deep breath although he does not feel short of breath.  He has a mild cough but no fever.  He denies any vomiting or diarrhea.  He has no new leg swelling.  He reports that he is compliant with his Eliquis  and has not missed any doses.  He states that the pain is not really similar to prior clots although every time he has had a blood clot it has been somewhat different.  I reviewed the past medical records.  The patient's most recent visit was on 4/14 with shortness of breath and pleuritic chest pain.  Workup was negative for PE at that time.  Previously his most recent outpatient counter was on 1/29 at [facility] urgent care for nasal congestion and sinus pain.   Physical Exam   Triage Vital Signs: ED Triage Vitals  Encounter Vitals Group     BP 03/14/24 0724 (!) 157/97     Systolic BP Percentile --      Diastolic BP Percentile --      Pulse Rate 03/14/24 0724 75     Resp 03/14/24 0724 20     Temp 03/14/24 0724 98.2 F (36.8 C)     Temp Source 03/14/24 0724 Oral     SpO2 03/14/24 0724 100 %     Weight 03/14/24 0722 243 lb (110.2 kg)     Height 03/14/24 0722 5\' 7"  (1.702 m)     Head Circumference --      Peak Flow --      Pain Score 03/14/24 0722 9     Pain Loc --      Pain Education --      Exclude from Growth Chart --     Most recent vital signs: Vitals:   03/14/24 0724  BP: (!) 157/97  Pulse: 75  Resp: 20  Temp: 98.2 F (36.8 C)  SpO2: 100%     General: Alert, well-appearing, no distress.  CV:  Good peripheral perfusion.   Resp:  Normal effort.  Lungs CTAB. Abd:  No distention.  Other:  No calf or popliteal swelling or tenderness.   ED Results / Procedures / Treatments   Labs (all labs ordered are listed, but only abnormal results are displayed) Labs Reviewed  BASIC METABOLIC PANEL WITH GFR - Abnormal; Notable for the following components:      Result Value   Glucose, Bld 147 (*)    All other components within normal limits  CBC  D-DIMER, QUANTITATIVE (NOT AT [facility])  TROPONIN I (HIGH SENSITIVITY)  TROPONIN I (HIGH SENSITIVITY)     EKG  ED ECG REPORT I, Lind Repine, the attending physician, personally viewed and interpreted this ECG.  Date: 03/14/2024 EKG Time: 0724 Rate: 69 Rhythm: normal sinus rhythm QRS Axis: normal Intervals: normal ST/T Wave abnormalities: Nonspecific T wave abnormalities Narrative Interpretation: no evidence of acute ischemia    RADIOLOGY  Chest x-ray: I independently viewed and interpreted the images; there is no focal consolidation or edema  PROCEDURES:  Critical  Care performed: No  Procedures   MEDICATIONS ORDERED IN ED: Medications  ketorolac  (TORADOL ) 15 MG/ML injection 15 mg (15 mg Intravenous Given 03/14/24 0836)     IMPRESSION / MDM / ASSESSMENT AND PLAN / ED COURSE  I reviewed the triage vital signs and the nursing notes.  58 year old male with PMH as noted above presents with right upper back pain which is somewhat pleuritic over the last week.  He has no active chest pain or shortness of breath at this time.  On exam the patient is well-appearing.  He is slightly hypertensive with otherwise normal vital signs.  Physical exam is unremarkable for acute findings.  Differential diagnosis includes, but is not limited to, musculoskeletal back pain, muscle strain or spasm, radiculopathy, GERD, bronchitis, ACS, PE.  I have a low suspicion for aortic dissection or other vascular cause.  Although the patient's risk for PE is certainly elevated  given his prior history, at this time my clinical suspicion for acute PE is low.  The patient has no tachycardia or hypoxia.  He has no chest pain or shortness of breath.  He has no acute DVT symptoms.  He has been compliant with Eliquis .  The patient has had multiple CT angiograms over the last year, and I am concerned about his overall radiation exposure.  Based on shared decision making with the patient, we will proceed with chest x-ray and lab workup initially, including a D-dimer in order to rule stratify given that his acute presentation and is less concerning for PE.  If there are concerning lab findings such as an elevated troponin or D-dimer we may pursue additional imaging.  Patient's presentation is most consistent with acute complicated illness / injury requiring diagnostic workup.  The patient is on the cardiac monitor to evaluate for evidence of arrhythmia and/or significant heart rate changes.  ----------------------------------------- 10:11 AM on 03/14/2024 -----------------------------------------  The patient's pain has significantly improved with Toradol .  Troponins are negative x 2.  D-dimer is negative.  CBC and BMP are unremarkable.  Chest x-ray is clear.  The patient remains with a normal heart rate and oxygen level.  He appears quite comfortable.  At this time there is no clinical evidence for acute PE.  I counseled the patient on the results of the workup.  I do not feel that there is an indication for CT angio at this time, the patient agrees.  He feels comfortable going home.  Overall I suspect musculoskeletal etiology of the pain.  I have prescribed a small quantity of tramadol for pain for the next few days given that the patient is on Eliquis  and so we will want to avoid further NSAIDs.  I gave strict return precautions and he expresses understanding.   FINAL CLINICAL IMPRESSION(S) / ED DIAGNOSES   Final diagnoses:  Upper back pain on right side     Rx / DC Orders    ED Discharge Orders          Ordered    traMADol (ULTRAM) 50 MG tablet  Every 6 hours PRN        03/14/24 1011             Note:  This document was prepared using Dragon voice recognition software and may include unintentional dictation errors.    Lind Repine, MD 03/14/24 1013

## 2024-03-26 ENCOUNTER — Other Ambulatory Visit: Payer: Self-pay

## 2024-03-26 ENCOUNTER — Encounter: Payer: Self-pay | Admitting: Emergency Medicine

## 2024-03-26 ENCOUNTER — Emergency Department
Admission: EM | Admit: 2024-03-26 | Discharge: 2024-03-26 | Disposition: A | Discharge status: Home / Self Care | Attending: Emergency Medicine | Admitting: Emergency Medicine

## 2024-03-26 DIAGNOSIS — Z7901 Long term (current) use of anticoagulants: Secondary | ICD-10-CM | POA: Diagnosis not present

## 2024-03-26 DIAGNOSIS — M549 Dorsalgia, unspecified: Secondary | ICD-10-CM | POA: Insufficient documentation

## 2024-03-26 DIAGNOSIS — R040 Epistaxis: Secondary | ICD-10-CM | POA: Diagnosis present

## 2024-03-26 MED ORDER — HYDROCODONE-ACETAMINOPHEN 5-325 MG PO TABS: 1.0000 | ORAL_TABLET | ORAL | 0 refills | Status: DC | PRN

## 2024-03-26 MED ORDER — OXYMETAZOLINE HCL 0.05 % NA SOLN
1.0000 | Freq: Once | NASAL | Status: AC
  Administered 2024-03-26: 1 via NASAL
  Filled 2024-03-26: qty 30

## 2024-03-26 MED ORDER — SILVER NITRATE-POT NITRATE 75-25 % EX MISC
1.0000 | Freq: Once | CUTANEOUS | Status: AC
Start: 2024-03-26 — End: 2024-03-26
  Administered 2024-03-26: 1 via TOPICAL
  Filled 2024-03-26: qty 10

## 2024-03-26 NOTE — ED Triage Notes (Signed)
 Patient to ED via POV for nosebleed- right nostril. PT reports it started when he got up this AM. Currently on blood thinners- did not take this AM.

## 2024-03-26 NOTE — Discharge Instructions (Addendum)
 Please take your pain medication as needed for your back pain but only as prescribed.  Do not drive or drink alcohol while taking pain medication.  Please follow-up with your doctor for ongoing pain management.  As we discussed if your nose starts bleeding again please spray 2 sprays of Afrin into the right nostril and clamp with a nasal clamp for 15 minutes.  If bleeding continues return to the emergency department.

## 2024-03-26 NOTE — ED Notes (Signed)
Nose clamp applied

## 2024-03-26 NOTE — ED Provider Notes (Signed)
 [facility] Provider Note    Event Date/Time   First MD Initiated Contact with Patient 03/26/24 0800     (approximate)  History   Chief Complaint: Epistaxis  HPI  Benjamin Delacruz is a 59 y.o. 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  According to the patient he has been having significant back pain since being diagnosed with his pulmonary embolism.  Ran out of his pain medication.  Patient states he was struggling to get out of bed when he bent over his nose started bleeding.  Denies any trauma.  Physical Exam   Triage Vital Signs: ED Triage Vitals  Encounter Vitals Group     BP 03/26/24 0759 (!) 146/91     Systolic BP Percentile --      Diastolic BP Percentile --      Pulse Rate 03/26/24 0759 88     Resp 03/26/24 0759 17     Temp 03/26/24 0759 98.1 F (36.7 C)     Temp Source 03/26/24 0759 Oral     SpO2 03/26/24 0759 97 %     Weight 03/26/24 0758 242 lb 8.1 oz (110 kg)     Height 03/26/24 0758 5\' 7"  (1.702 m)     Head Circumference --      Peak Flow --      Pain Score 03/26/24 0758 9     Pain Loc --      Pain Education --      Exclude from Growth Chart --     Most recent vital signs: Vitals:   03/26/24 0759  BP: (!) 146/91  Pulse: 88  Resp: 17  Temp: 98.1 F (36.7 C)  SpO2: 97%    General: Awake, no distress.  CV:  Good peripheral perfusion.   Resp:  Normal effort.   Other:  Patient has dried blood in the right nostril with scabbing on the anterior septum.  No active bleed.   ED Results / Procedures / Treatments   MEDICATIONS ORDERED IN ED: Medications  silver nitrate applicators applicator 1 Application (has no administration in time range)  oxymetazoline  (AFRIN) 0.05 % nasal spray 1 spray (has no administration in time range)     IMPRESSION / MDM / ASSESSMENT AND PLAN / ED COURSE  I reviewed the triage vital signs and the nursing  notes.  Patient's presentation is most consistent with acute illness / injury with system symptoms.  Patient presents for a nosebleed to the right nostril.  Patient currently on Eliquis  for recently diagnosed PE.  States increased back pain due to his PE.  Will struggling to get out of bed when he developed a nosebleed after bending over.  On examination patient has a small area of scab/clot to the anterior septum likely the source of the bleed but currently hemostatic.  I used a small silver nitrate applicator and after touching the clot it began bleeding significantly once again, indicating this was the source of the bleed.  I was able to use 2 silver nitrate sticks and stop the bleed with chemical cauterization.  Will use Afrin and clamp and reassess in 20 minutes to ensure that there is no further bleeding.  As long as there is no further bleeding and believe the patient could be safely discharged home.  Given the patient's increased back pain I discussed with the patient I could prescribe him a short course of pain medication however  he would need to follow-up with his doctor for ongoing pain management.  Patient agreeable to plan.  Nose clamp removed, patient remains hemostatic greater than 30 minutes.  As the patient appears well we will discharge from the emergency department have him follow-up with his doctor.  Patient agreeable to plan of care.  FINAL CLINICAL IMPRESSION(S) / ED DIAGNOSES   Epistaxis Back pain   Note:  This document was prepared using Dragon voice recognition software and may include unintentional dictation errors.   Ruth Cove, MD 03/26/24 [id number]

## 2024-04-01 ENCOUNTER — Encounter: Payer: Self-pay | Admitting: Family Medicine

## 2024-04-01 ENCOUNTER — Other Ambulatory Visit: Payer: Self-pay | Admitting: Family Medicine

## 2024-04-01 ENCOUNTER — Ambulatory Visit (INDEPENDENT_AMBULATORY_CARE_PROVIDER_SITE_OTHER): Admitting: Family Medicine

## 2024-04-01 VITALS — BP 150/84 | HR 64 | Ht 67.0 in | Wt 252.0 lb

## 2024-04-01 DIAGNOSIS — Z7901 Long term (current) use of anticoagulants: Secondary | ICD-10-CM

## 2024-04-01 DIAGNOSIS — Z86711 Personal history of pulmonary embolism: Secondary | ICD-10-CM

## 2024-04-01 DIAGNOSIS — M792 Neuralgia and neuritis, unspecified: Secondary | ICD-10-CM

## 2024-04-01 DIAGNOSIS — M549 Dorsalgia, unspecified: Secondary | ICD-10-CM | POA: Diagnosis not present

## 2024-04-01 DIAGNOSIS — E7800 Pure hypercholesterolemia, unspecified: Secondary | ICD-10-CM

## 2024-04-01 DIAGNOSIS — R600 Localized edema: Secondary | ICD-10-CM | POA: Diagnosis not present

## 2024-04-01 DIAGNOSIS — I10 Essential (primary) hypertension: Secondary | ICD-10-CM

## 2024-04-01 DIAGNOSIS — R7309 Other abnormal glucose: Secondary | ICD-10-CM

## 2024-04-01 DIAGNOSIS — Z86718 Personal history of other venous thrombosis and embolism: Secondary | ICD-10-CM

## 2024-04-01 DIAGNOSIS — Z0000 Encounter for general adult medical examination without abnormal findings: Secondary | ICD-10-CM

## 2024-04-01 DIAGNOSIS — K219 Gastro-esophageal reflux disease without esophagitis: Secondary | ICD-10-CM

## 2024-04-01 DIAGNOSIS — R351 Nocturia: Secondary | ICD-10-CM

## 2024-04-01 MED ORDER — OMEPRAZOLE 40 MG PO CPDR: 40.0000 mg | DELAYED_RELEASE_CAPSULE | Freq: Every day | ORAL | 3 refills | Status: AC

## 2024-04-01 MED ORDER — FUROSEMIDE 20 MG PO TABS: 20.0000 mg | ORAL_TABLET | Freq: Every day | ORAL | 3 refills | Status: AC | PRN

## 2024-04-01 MED ORDER — SUCRALFATE 1 G PO TABS: 1.0000 g | ORAL_TABLET | Freq: Three times a day (TID) | ORAL | 2 refills | Status: AC

## 2024-04-01 MED ORDER — GABAPENTIN 300 MG PO CAPS: 300.0000 mg | ORAL_CAPSULE | Freq: Every day | ORAL | 2 refills | Status: AC

## 2024-04-01 NOTE — Progress Notes (Signed)
 Subjective:    Patient ID: Benjamin Delacruz, male    DOB: 25-Mar-1966, 58 y.o.   MRN: [id number]  Benjamin Delacruz is a 58 y.o. male presenting on 04/01/2024 for Back Pain  Patient presents for a same day appointment.  HPI  Discussed the use of AI scribe software for clinical note transcription with the patient, who gave verbal consent to proceed.  History of Present Illness   Benjamin Delacruz is a 58 year old male with a history of blood clots who presents with persistent back pain.  He has been experiencing persistent back pain for the past three weeks, located in the right upper back and exacerbated by deep breathing. The pain radiates and feels similar to a blood clot with past history of pulmonary embolus. He continues Eliquis  anticoagulation.  He has been to ED 7 times in past year since 06/2023 visit with me. He has a history of blood clots and was concerned about a potential clot when the pain began. He visited the hospital multiple times, undergoing tests including EKG, chest x-ray, and blood tests, all of which showed no signs of a blood clot. Despite being prescribed tramadol , the pain persisted. Repeat visit given rx Hydrocodone  AS NEEDED without relief. - Used heating pad, as well did not resolve it - Using Tylenol  AS NEEDED provides some relief actually  Recently, he experienced epistaxis in his sinuses, which was treated by cauterization. Despite this, his back pain persisted. He has tried over-the-counter medications for gas and acid reflux, initially suspecting these issues might be related to the pain. He recalls having acid reflux a few days before the onset of the back pain.  He has not taken omeprazole or other prescription acid blockers recently due to misplacing them after his wife's passing and subsequent relocation. He has been using over-the-counter remedies instead. No nausea or vomiting after eating. No shortness of breath, nausea, or vomiting. Reports a little bit of a cough and  sharp pains with breathing. No burning or heartburn symptoms currently, but had acid reflux before the onset of back pain.          04/01/2024   11:29 AM 04/01/2024   11:28 AM 07/03/2023    9:23 AM  Depression screen PHQ 2/9  Decreased Interest 0 0 0  Down, Depressed, Hopeless 0 0 0  PHQ - 2 Score 0 0 0  Altered sleeping 1  0  Tired, decreased energy 1  0  Change in appetite 0  0  Feeling bad or failure about yourself  0  0  Trouble concentrating 0  0  Moving slowly or fidgety/restless 0  0  Suicidal thoughts 0  0  PHQ-9 Score 2  0  Difficult doing work/chores Not difficult at all  Not difficult at all       04/01/2024   11:29 AM 07/03/2023    9:23 AM 10/04/2022    8:26 AM  GAD 7 : Generalized Anxiety Score  Nervous, Anxious, on Edge 0 0 0  Control/stop worrying 0 0 0  Worry too much - different things 0 0 0  Trouble relaxing 0 0 0  Restless 0 0 0  Easily annoyed or irritable 0 0 0  Afraid - awful might happen 0 0 0  Total GAD 7 Score 0 0 0  Anxiety Difficulty Not difficult at all Not difficult at all Not difficult at all    Social History   Tobacco Use   Smoking status: Never   Smokeless  tobacco: Never  Vaping Use   Vaping status: Never Used  Substance Use Topics   Alcohol use: Not Currently   Drug use: Never    Review of Systems Per HPI unless specifically indicated above     Objective:     BP (!) 150/84 (BP Location: Left Arm, Cuff Size: Normal)   Pulse 64   Ht 5\' 7"  (1.702 m)   Wt 252 lb (114.3 kg)   SpO2 99%   BMI 39.47 kg/m   Wt Readings from Last 3 Encounters:  04/01/24 252 lb (114.3 kg)  03/26/24 242 lb 8.1 oz (110 kg)  03/14/24 243 lb (110.2 kg)    Physical Exam Vitals and nursing note reviewed.  Constitutional:      General: He is not in acute distress.    Appearance: He is well-developed. He is obese. He is not diaphoretic.     Comments: Well-appearing, comfortable, cooperative  HENT:     Head: Normocephalic and atraumatic.  Eyes:      General:        Right eye: No discharge.        Left eye: No discharge.     Conjunctiva/sclera: Conjunctivae normal.  Neck:     Thyroid: No thyromegaly.  Cardiovascular:     Rate and Rhythm: Normal rate and regular rhythm.     Pulses: Normal pulses.     Heart sounds: Normal heart sounds. No murmur heard. Pulmonary:     Effort: Pulmonary effort is normal. No respiratory distress.     Breath sounds: Normal breath sounds. No wheezing or rales.  Musculoskeletal:        General: Normal range of motion.     Cervical back: Normal range of motion and neck supple.  Lymphadenopathy:     Cervical: No cervical adenopathy.  Skin:    General: Skin is warm and dry.     Findings: No erythema or rash.  Neurological:     Mental Status: He is alert and oriented to person, place, and time. Mental status is at baseline.  Psychiatric:        Behavior: Behavior normal.     Comments: Well groomed, good eye contact, normal speech and thoughts     Results for orders placed or performed during the hospital encounter of 03/14/24  Basic metabolic panel   Collection Time: 03/14/24  7:27 AM  Result Value Ref Range   Sodium 136 135 - 145 mmol/L   Potassium 3.9 3.5 - 5.1 mmol/L   Chloride 108 98 - 111 mmol/L   CO2 22 22 - 32 mmol/L   Glucose, Bld 147 (H) 70 - 99 mg/dL   BUN 16 6 - 20 mg/dL   Creatinine, Ser 1.61 0.61 - 1.24 mg/dL   Calcium  9.6 8.9 - 10.3 mg/dL   GFR, Estimated >09 >60 mL/min   Anion gap 6 5 - 15  CBC   Collection Time: 03/14/24  7:27 AM  Result Value Ref Range   WBC 7.3 4.0 - 10.5 K/uL   RBC 4.97 4.22 - 5.81 MIL/uL   Hemoglobin 14.5 13.0 - 17.0 g/dL   HCT 45.4 09.8 - 11.9 %   MCV 85.1 80.0 - 100.0 fL   MCH 29.2 26.0 - 34.0 pg   MCHC 34.3 30.0 - 36.0 g/dL   RDW 14.7 82.9 - 56.2 %   Platelets 187 150 - 400 K/uL   nRBC 0.0 0.0 - 0.2 %  Troponin I (High Sensitivity)   Collection Time: 03/14/24  7:27 AM  Result Value Ref Range   Troponin I (High Sensitivity) 4 <18 ng/L   D-dimer, quantitative   Collection Time: 03/14/24  8:30 AM  Result Value Ref Range   D-Dimer, Quant 0.30 0.00 - 0.50 ug/mL-FEU  Troponin I (High Sensitivity)   Collection Time: 03/14/24  9:10 AM  Result Value Ref Range   Troponin I (High Sensitivity) 4 <18 ng/L      Assessment & Plan:   Problem List Items Addressed This Visit   None Visit Diagnoses       Gastroesophageal reflux disease without esophagitis    -  Primary   Relevant Medications   omeprazole (PRILOSEC) 40 MG capsule   sucralfate (CARAFATE) 1 g tablet     Neuropathic pain       Relevant Medications   gabapentin (NEURONTIN) 300 MG capsule     Upper back pain       Relevant Medications   gabapentin (NEURONTIN) 300 MG capsule     Bilateral lower extremity edema       Relevant Medications   furosemide  (LASIX ) 20 MG tablet        Right Upper back pain / flank pain Persistent upper back pain for three weeks, some neuropathic or radiating pain to R side.  Multiple ED visits and labs, imaging, evaluation. Trial on pain meds from ED unresponsive to tramadol  or hydrocodone . Extensive testing ruled out serious conditions.  Differential includes musculoskeletal, neuropathic, or gastrointestinal causes. Gabapentin prescribed for neuropathic pain.  - Prescribe gabapentin 300 mg at bedtime for neuropathic pain and can help him rest. - Continue acetaminophen  as needed. - Prescribe omeprazole 40 mg daily before breakfast for possible GERD PUD etiology given past history and current symptoms - Prescribe sucralfate as needed, especially at bedtime.  History of PE / DVT On anticoagulation with Eliquis   Epistaxis with anticoagulation ED visit Recent epistaxis treated with cauterization. No active bleeding.  General Health Maintenance Routine health maintenance visit scheduled. - Schedule routine health maintenance visit for blood panel and follow-up.        No orders of the defined types were placed in this  encounter.   Meds ordered this encounter  Medications   omeprazole (PRILOSEC) 40 MG capsule    Sig: Take 1 capsule (40 mg total) by mouth daily before breakfast.    Dispense:  30 capsule    Refill:  3   sucralfate (CARAFATE) 1 g tablet    Sig: Take 1 tablet (1 g total) by mouth 4 (four) times daily -  with meals and at bedtime. As needed for acid reflux indigestion    Dispense:  30 tablet    Refill:  2   gabapentin (NEURONTIN) 300 MG capsule    Sig: Take 1 capsule (300 mg total) by mouth at bedtime.    Dispense:  30 capsule    Refill:  2   furosemide  (LASIX ) 20 MG tablet    Sig: Take 1-2 tablets (20-40 mg total) by mouth daily as needed for edema or fluid.    Dispense:  30 tablet    Refill:  3    Follow up plan: Return if symptoms worsen or fail to improve, for 4-6 weeks fasting lab then Physical.  Future labs ordered for 04/30/24   Domingo Friend, DO [facility] 04/01/2024, 11:28 AM

## 2024-04-01 NOTE — Patient Instructions (Addendum)
 Thank you for coming to the office today.  Your back pain may be pinched nerve related or can be indigestion stomach acid.  Start Gabapentin 300mg  capsules, take at night - 1 dose = 300mg , stick with one nightly for several nights to see how you feel. In future we can adjust dose if need, let me know. Caution sedation or sleepy drowsy on med side effect  Okay to keep taking Tylenol  as needed.  It could be uncontrolled Acid Reflux (GERD) and may have developed an Ulcer (Peptic Ulcer of stomach).  Starting tomorrow before breakfast Omeprazole 40mg  daily. Prefer to take this med about 30 min before breakfast or 1st meal of day for 4 weeks, don't stop taking unless we discuss first. Probably will need for about 8-12 weeks total if this ends up being an Ulcer.   Take other prescribed medicine Carafate (Sucralfate) as needed up to 4 times daily (3 meals and bedtime)  to coat stomach lining to ease symptoms, if it helps reduce symptoms then it is more likely to be due to acid and/or ulcer.  DIET RECOMMENDATIONS - Avoid spicy, greasy, fried foods, also things like caffeine, dark chocolate, peppermint can worsen - Avoid large meals and late night snacks, also do not go more than 4-5 hours without a snack or meal (not eating will worsen reflux symptoms due to stomach acid) - You may also elevate the head of your bed at night to sleep at very slight incline to help reduce symptoms   If the problem improves but keeps coming back, we can discuss higher dose or longer course at next visit.   If symptoms are worsening or persistent despite treatment or develop any different severe esophagus or abdominal pain, unable to swallow solids or liquids, nausea, vomiting especially blood in vomit, fever/chills, or unintentional weight loss / no appetite, please follow-up sooner in office or seek more immediate medical attention at hospital Emergency Department.  Regarding other medicines:   - STOP taking  Ibuprofen, Advil, Motrin, Goody's / BC powder - DO NOT take without discussing with your doctor. These medicines can put you at high risk for future bleeding.  If need pain medicine, may take Tylenol  Extra Strength (Acetaminophen ) 500mg  tabs - take 1 to 2 tabs per dose (max 1000mg ) every 6-8 hours for pain (take regularly, don't skip a dose for next 7 days), max 24 hour daily dose is 6 tablets or 3000mg . In the future you can repeat the same everyday Tylenol  course for 1-2 weeks at a time.   Please schedule a Follow-up Appointment to: Return if symptoms worsen or fail to improve, for 4-6 weeks fasting lab then Physical.  If you have any other questions or concerns, please feel free to call the office or send a message through MyChart. You may also schedule an earlier appointment if necessary.  Additionally, you may be receiving a survey about your experience at our office within a few days to 1 week by e-mail or mail. We value your feedback.  Domingo Friend, DO [facility], [place]

## 2024-04-30 ENCOUNTER — Other Ambulatory Visit

## 2024-04-30 DIAGNOSIS — Z7901 Long term (current) use of anticoagulants: Secondary | ICD-10-CM

## 2024-04-30 DIAGNOSIS — E7800 Pure hypercholesterolemia, unspecified: Secondary | ICD-10-CM

## 2024-04-30 DIAGNOSIS — I10 Essential (primary) hypertension: Secondary | ICD-10-CM

## 2024-04-30 DIAGNOSIS — R7309 Other abnormal glucose: Secondary | ICD-10-CM

## 2024-04-30 DIAGNOSIS — Z86711 Personal history of pulmonary embolism: Secondary | ICD-10-CM

## 2024-04-30 DIAGNOSIS — Z0000 Encounter for general adult medical examination without abnormal findings: Secondary | ICD-10-CM

## 2024-04-30 DIAGNOSIS — R351 Nocturia: Secondary | ICD-10-CM

## 2024-05-12 ENCOUNTER — Encounter: Admitting: Family Medicine

## 2024-05-12 ENCOUNTER — Emergency Department
Admission: EM | Admit: 2024-05-12 | Discharge: 2024-05-12 | Disposition: A | Discharge status: Home / Self Care | Source: Ambulatory Visit | Attending: Emergency Medicine | Admitting: Emergency Medicine

## 2024-05-12 ENCOUNTER — Ambulatory Visit: Admission: EM | Admit: 2024-05-12 | Discharge: 2024-05-12

## 2024-05-12 ENCOUNTER — Encounter: Payer: Self-pay | Admitting: Emergency Medicine

## 2024-05-12 ENCOUNTER — Emergency Department

## 2024-05-12 ENCOUNTER — Other Ambulatory Visit: Payer: Self-pay

## 2024-05-12 DIAGNOSIS — I160 Hypertensive urgency: Secondary | ICD-10-CM

## 2024-05-12 DIAGNOSIS — T63461A Toxic effect of venom of wasps, accidental (unintentional), initial encounter: Secondary | ICD-10-CM | POA: Diagnosis not present

## 2024-05-12 DIAGNOSIS — R519 Headache, unspecified: Secondary | ICD-10-CM | POA: Diagnosis present

## 2024-05-12 DIAGNOSIS — R030 Elevated blood-pressure reading, without diagnosis of hypertension: Secondary | ICD-10-CM | POA: Diagnosis not present

## 2024-05-12 LAB — CBC
HCT: 42.5 % (ref 39.0–52.0)
Hemoglobin: 14.3 g/dL (ref 13.0–17.0)
MCH: 29.2 pg (ref 26.0–34.0)
MCHC: 33.6 g/dL (ref 30.0–36.0)
MCV: 86.7 fL (ref 80.0–100.0)
Platelets: 179 K/uL (ref 150–400)
RBC: 4.9 MIL/uL (ref 4.22–5.81)
RDW: 13 % (ref 11.5–15.5)
WBC: 7.6 K/uL (ref 4.0–10.5)
nRBC: 0 % (ref 0.0–0.2)

## 2024-05-12 LAB — BASIC METABOLIC PANEL WITH GFR
Anion gap: 8 (ref 5–15)
BUN: 13 mg/dL (ref 6–20)
CO2: 20 mmol/L — ABNORMAL LOW (ref 22–32)
Calcium: 9 mg/dL (ref 8.9–10.3)
Chloride: 110 mmol/L (ref 98–111)
Creatinine, Ser: 1.06 mg/dL (ref 0.61–1.24)
GFR, Estimated: >60 mL/min (ref >60)
Glucose, Bld: 93 mg/dL (ref 70–99)
Potassium: 3.5 mmol/L (ref 3.5–5.1)
Sodium: 138 mmol/L (ref 135–145)

## 2024-05-12 MED ORDER — ACETAMINOPHEN-CAFFEINE 500-65 MG PO TABS
1.0000 | ORAL_TABLET | Freq: Once | ORAL | Status: AC
  Administered 2024-05-12: 1 via ORAL
  Filled 2024-05-12: qty 1

## 2024-05-12 MED ORDER — ASPIRIN-ACETAMINOPHEN-CAFFEINE 250-250-65 MG PO TABS: 1.0000 | ORAL_TABLET | Freq: Once | ORAL | Status: DC

## 2024-05-12 NOTE — ED Triage Notes (Signed)
 Pt was stung by a wasp today 30 minutes ago on his left hand. He has pain in his left hand and a headache.

## 2024-05-12 NOTE — ED Notes (Signed)
 Patient is being discharged from the Urgent Care and sent to the Emergency Department via personal vehicle . Per Harriette Motto NP, patient is in need of higher level of care due to hypertension/ insect bite. Patient is aware and verbalizes understanding of plan of care.  Vitals:   05/12/24 1146  BP: (!) 153/117  Pulse: 83  Resp: 16  Temp: 98.3 F (36.8 C)  SpO2: 96%

## 2024-05-12 NOTE — ED Provider Notes (Addendum)
 [facility]    CSN: [id number] Arrival date & time: 05/12/24  1137      History   Chief Complaint Chief Complaint  Patient presents with   Insect Bite    HPI Benjamin Delacruz is a 58 y.o. male.   HPI  58 year old male with past medical history significant for high cholesterol, MI, PE, sleep apnea, DVT presents for evaluation of wasp sting to his left index finger that happened 30 minutes prior to arrival.  He reports he was stung by 2 wasps.  He is complaining of significant frontal headache but he denies any chest pain or shortness of breath.  He also denies any swelling of his tongue or lips or tightness in his throat.  No itching.  He has not taken any medication.  Past Medical History:  Diagnosis Date   DVT (deep venous thrombosis) (HCC)    Hypercholesteremia    MI (myocardial infarction) (HCC)    Pulmonary embolism (HCC)    Sleep apnea     Patient Active Problem List   Diagnosis Date Noted   Recurrent pulmonary emboli (HCC) 06/28/2023   Obesity (BMI 30-39.9) 06/28/2023   AKI (acute kidney injury) (HCC) 06/28/2023   Morbid obesity (HCC) 10/04/2022   Pure hypercholesterolemia 08/23/2022   Essential hypertension 08/23/2022   Chronic anticoagulation 08/23/2022   History of pulmonary embolus (PE) 08/23/2022   History of deep venous thrombosis (DVT) of distal vein of right lower extremity 08/23/2022    Past Surgical History:  Procedure Laterality Date   APPENDECTOMY     CARDIAC STENTS     X2   COLONOSCOPY WITH PROPOFOL  N/A 08/09/2021   Procedure: COLONOSCOPY WITH PROPOFOL ;  Surgeon: Therisa Bi, MD;  Location: [facility] ENDOSCOPY;  Service: Gastroenterology;  Laterality: N/A;   IVC FILTER INSERTION     TONSILLECTOMY         Home Medications    Prior to Admission medications   Medication Sig Start Date End Date Taking? Authorizing Provider  albuterol  (VENTOLIN  HFA) 108 (90 Base) MCG/ACT inhaler Inhale 2 puffs into the lungs every 4 (four) hours as needed.  07/06/23   Jacolyn Pae, MD  apixaban  (ELIQUIS ) 5 MG TABS tablet Take 1 tablet (5 mg total) by mouth 2 (two) times daily. 07/04/23   Karamalegos, Marsa PARAS, DO  atorvastatin  (LIPITOR) 20 MG tablet Take 1 tablet (20 mg total) by mouth at bedtime. 08/23/22   Karamalegos, Marsa PARAS, DO  carvedilol  (COREG ) 3.125 MG tablet TAKE 1 TABLET(3.125 MG) BY MOUTH TWICE DAILY WITH A MEAL 11/21/23   Karamalegos, Marsa PARAS, DO  diclofenac  Sodium (VOLTAREN ) 1 % GEL Apply 4 g topically 4 (four) times daily. To affected joint as needed for pain 06/30/23   Alexander, Natalie, DO  furosemide  (LASIX ) 20 MG tablet Take 1-2 tablets (20-40 mg total) by mouth daily as needed for edema or fluid. 04/01/24   Karamalegos, Marsa PARAS, DO  gabapentin  (NEURONTIN ) 300 MG capsule Take 1 capsule (300 mg total) by mouth at bedtime. 04/01/24   Karamalegos, Marsa PARAS, DO  ipratropium (ATROVENT ) 0.06 % nasal spray Place 2 sprays into both nostrils 4 (four) times daily. 12/05/23   Bernardino Ditch, NP  latanoprost (XALATAN) 0.005 % ophthalmic solution Place 1 drop into both eyes at bedtime. 06/30/23   [provider]  omeprazole  (PRILOSEC) 40 MG capsule Take 1 capsule (40 mg total) by mouth daily before breakfast. 04/01/24   Edman, Marsa PARAS, DO  ondansetron  (ZOFRAN -ODT) 8 MG disintegrating tablet Take 1 tablet (8  mg total) by mouth every 8 (eight) hours as needed for nausea or vomiting. 08/28/23   Bradler, Evan K, MD  sucralfate  (CARAFATE ) 1 g tablet Take 1 tablet (1 g total) by mouth 4 (four) times daily -  with meals and at bedtime. As needed for acid reflux indigestion 04/01/24   Edman Marsa PARAS, DO    Family History Family History  Problem Relation Age of Onset   Diabetes Mother    Liver cancer Sister     Social History Social History   Tobacco Use   Smoking status: Never   Smokeless tobacco: Never  Vaping Use   Vaping status: Never Used  Substance Use Topics   Alcohol use: Not Currently    Drug use: Never     Allergies   Patient has no known allergies.   Review of Systems Review of Systems  HENT:  Negative for facial swelling, trouble swallowing and voice change.   Respiratory:  Negative for shortness of breath.   Cardiovascular:  Negative for chest pain.  Skin:  Positive for wound.  Neurological:  Positive for headaches.     Physical Exam Triage Vital Signs ED Triage Vitals  Encounter Vitals Group     BP      Girls Systolic BP Percentile      Girls Diastolic BP Percentile      Boys Systolic BP Percentile      Boys Diastolic BP Percentile      Pulse      Resp      Temp      Temp src      SpO2      Weight      Height      Head Circumference      Peak Flow      Pain Score      Pain Loc      Pain Education      Exclude from Growth Chart    No data found.  Updated Vital Signs BP (!) 153/117 (BP Location: Right Arm)   Pulse 83   Temp 98.3 F (36.8 C) (Oral)   Resp 16   SpO2 96%   Visual Acuity Right Eye Distance:   Left Eye Distance:   Bilateral Distance:    Right Eye Near:   Left Eye Near:    Bilateral Near:     Physical Exam Vitals and nursing note reviewed.  Constitutional:      Appearance: Normal appearance. He is not ill-appearing.  HENT:     Head: Normocephalic and atraumatic.     Mouth/Throat:     Mouth: Mucous membranes are moist.     Pharynx: Oropharynx is clear. No oropharyngeal exudate or posterior oropharyngeal erythema.     Comments: Airways patent. Cardiovascular:     Rate and Rhythm: Normal rate.     Pulses: Normal pulses.     Heart sounds: Normal heart sounds. No murmur heard.    No friction rub. No gallop.  Pulmonary:     Effort: Pulmonary effort is normal.     Breath sounds: Normal breath sounds. No wheezing, rhonchi or rales.  Skin:    General: Skin is warm and dry.     Capillary Refill: Capillary refill takes less than 2 seconds.     Findings: No erythema.  Neurological:     General: No focal deficit  present.     Mental Status: He is alert and oriented to person, place, and time.  UC Treatments / Results  Labs (all labs ordered are listed, but only abnormal results are displayed) Labs Reviewed - No data to display  EKG   Radiology No results found.  Procedures Procedures (including critical care time)  Medications Ordered in UC Medications - No data to display  Initial Impression / Assessment and Plan / UC Course  I have reviewed the triage vital signs and the nursing notes.  Pertinent labs & imaging results that were available during my care of the patient were reviewed by me and considered in my medical decision making (see chart for details).   Patient is a pleasant 58 year old male presenting for evaluation of wasp sting to his left index finger as outlined HPI above.  He reports that he has had a previous allergic reaction to wasp sting but the last time he was stung by over 30 wasps and this time he was only stung by 2.  He denies any swelling of his lips or tongue, tightness of throat, or shortness of breath.  His predominant complaint is pain in the left index finger from the wasp sting as well as a frontal headache.  His blood pressure is markedly elevated at 153/117.  He reports that he took his blood pressure medication this morning and he has not missed any doses of his blood pressure medication.  It is unclear if the blood pressure is elevated as a result of the wasp sting, or from poorly controlled hypertension.  As you can see in image above, there is mild erythema and edema to the left index finger where the patient received the wasp sting.  Immediate site of envenomation is not readily apparent based upon visual inspection of the finger.   In any event, I do feel the patient needs to be evaluated in the emergency department where he can be given medication to lower his blood pressure, he can have his blood pressure monitored, and also will be treated for his  wasp sting.  I do feel this patient is safe to travel via POV.  He will go to [facility].  He left ambulatory in stable condition.   Final Clinical Impressions(s) / UC Diagnoses   Final diagnoses:  Hypertensive urgency  Severe frontal headaches  Wasp sting, accidental or unintentional, initial encounter     Discharge Instructions      As we discussed, your blood pressure is markedly elevated here in urgent care and you need to be monitored and given medication to bring your blood pressure down.  This elevated blood pressure may be causing your headache.  The headache and elevated blood pressure may be result of your wasp sting.  Please go to the emergency department at [facility] for management of both.  He is going.     ED Prescriptions   None    PDMP not reviewed this encounter.   Bernardino Ditch, NP 05/12/24 1158    Bernardino Ditch, NP 05/12/24 1159

## 2024-05-12 NOTE — ED Triage Notes (Signed)
 Pt arrives via POV with c/o a bee sting to the left hand and went to see UC but they were not able to treat him bc his BP was too high. Pt states they are starting to get dizzy and nauseous.  Pt is A&Ox4 and ambulatory during triage.

## 2024-05-12 NOTE — Discharge Instructions (Addendum)
 As we discussed, your blood pressure is markedly elevated here in urgent care and you need to be monitored and given medication to bring your blood pressure down.  This elevated blood pressure may be causing your headache.  The headache and elevated blood pressure may be result of your wasp sting.  Please go to the emergency department at [facility] for management of both.  He is going.

## 2024-05-12 NOTE — Discharge Instructions (Signed)
 Take your blood pressure medication as directed.  Follow with your primary care doctor for further evaluation.  Return to the ER for any new or worsening symptoms.

## 2024-05-12 NOTE — ED Provider Notes (Signed)
 [facility] Provider Note    Event Date/Time   First MD Initiated Contact with Patient 05/12/24 1500     (approximate)   History   Insect Bite and Hypertension   HPI  Benjamin Delacruz is a 58 year old male presenting to the emergency department for evaluation of wasp sting and headache.  Earlier today, patient patient reports that he was stung by a wasp twice over his left hand.  Reports history of more severe allergic reaction in the past to wasp sting, but was after 30 bites.  He was initially seen in urgent care.  He reports that on his way there he developed a generalized headache with associated nausea.  No chest pain or shortness of breath.  No throat swelling, rash.  At urgent care, he was noted to be hypertensive, documented at 153/117.  He was sent to the ER for further evaluation in the setting of his elevated blood pressure.       Physical Exam   Triage Vital Signs: ED Triage Vitals  Encounter Vitals Group     BP 05/12/24 1257 (!) 163/108     Girls Systolic BP Percentile --      Girls Diastolic BP Percentile --      Boys Systolic BP Percentile --      Boys Diastolic BP Percentile --      Pulse Rate 05/12/24 1257 79     Resp 05/12/24 1257 17     Temp 05/12/24 1257 98 F (36.7 C)     Temp Source 05/12/24 1257 Oral     SpO2 05/12/24 1257 97 %     Weight 05/12/24 1255 244 lb (110.7 kg)     Height 05/12/24 1255 5' 7 (1.702 m)     Head Circumference --      Peak Flow --      Pain Score 05/12/24 1254 9     Pain Loc --      Pain Education --      Exclude from Growth Chart --     Most recent vital signs: Vitals:   05/12/24 1729 05/12/24 1913  BP:  (!) 159/93  Pulse:  62  Resp:  16  Temp: (!) 97.4 F (36.3 C) 97.7 F (36.5 C)  SpO2:  100%     General: Awake, interactive  CV:  Regular rate, good peripheral perfusion.  Resp:  Unlabored respirations, lungs clear to auscultation Abd:  Nondistended. Neuro:  Alert and oriented, normal  extraocular movements, symmetric facial movement, sensation intact over bilateral upper and lower extremities with 5 out of 5 strength.  Normal finger-to-nose testing. Skin:  2 areas over the left index finger where patient reports he was bit.  No significant hand swelling.  Full range of motion.  No visible retained foreign bodies.  Normal capillary refill.    ED Results / Procedures / Treatments   Labs (all labs ordered are listed, but only abnormal results are displayed) Labs Reviewed  BASIC METABOLIC PANEL WITH GFR - Abnormal; Notable for the following components:      Result Value   CO2 20 (*)    All other components within normal limits  CBC     EKG EKG independently reviewed and interpreted by myself demonstrates:  EKG demonstrates normal sinus rhythm rate of 75, PR 172, QRS 96, QTc 439, no acute ST changes  RADIOLOGY Imaging independently reviewed and interpreted by myself demonstrates:  CT head without acute bleed  Formal Radiology Read:  CT  Head Wo Contrast Result Date: 05/12/2024 CLINICAL DATA:  Headache EXAM: CT HEAD WITHOUT CONTRAST TECHNIQUE: Contiguous axial images were obtained from the base of the skull through the vertex without intravenous contrast. RADIATION DOSE REDUCTION: This exam was performed according to the departmental dose-optimization program which includes automated exposure control, adjustment of the mA and/or kV according to patient size and/or use of iterative reconstruction technique. COMPARISON:  MRI 08/28/2023, head CT 08/28/2023 FINDINGS: Brain: No acute territorial infarction, hemorrhage or new intracranial mass. Slightly enlarged appearance of the pituitary gland. The ventricles are nonenlarged Vascular: No hyperdense vessel or unexpected calcification. Skull: Normal. Negative for fracture or focal lesion. Sinuses/Orbits: No acute finding. Other: None IMPRESSION: 1. Negative non contrasted CT appearance of the brain. 2. Slightly enlarged appearance  of the pituitary gland, reference MRI report 08/28/2023 Electronically Signed   By: Luke Bun M.D.   On: 05/12/2024 18:38    PROCEDURES:  Critical Care performed: No  Procedures   MEDICATIONS ORDERED IN ED: Medications  acetaminophen -caffeine  (EXCEDRIN  TENSION HEADACHE) 500-65 MG per tablet 1 tablet (1 tablet Oral Given 05/12/24 1722)     IMPRESSION / MDM / ASSESSMENT AND PLAN / ED COURSE  I reviewed the triage vital signs and the nursing notes.  Differential diagnosis includes, but is not limited to, hypertension in the setting of acute pain related to wasp sting, medication nonadherence, hypertensive emergency including intracranial bleed, no chest pain, shortness of breath suggestive of cardiac strain or pulmonary edema  Patient's presentation is most consistent with acute presentation with potential threat to life or bodily function.  58 year old male presenting to the emergency department for evaluation of lasting an elevated blood pressure.  Blood pressure significantly improved here without intervention.  Basic labs sent from triage overall reassuring.  Overall reassuring neurologic exam, but given report of severe headache different than patient's normal and more significantly elevated blood pressure in urgent care, we will go ahead and obtain CT head to further evaluate.  CT head without acute bleed.  Patient reassessed, and reports improvement in headache.  BP improved here.  Do think patient stable for discharge with outpatient follow-up.  Strict return precautions provided.  Patient discharged in stable condition.     FINAL CLINICAL IMPRESSION(S) / ED DIAGNOSES   Final diagnoses:  Wasp sting, accidental or unintentional, initial encounter  Acute nonintractable headache, unspecified headache type  Elevated blood pressure reading     Rx / DC Orders   ED Discharge Orders     None        Note:  This document was prepared using Dragon voice recognition  software and may include unintentional dictation errors.   Levander Slate, MD 05/12/24 [phone]

## 2024-10-07 ENCOUNTER — Other Ambulatory Visit: Payer: Self-pay | Admitting: Family Medicine

## 2024-10-07 DIAGNOSIS — I2699 Other pulmonary embolism without acute cor pulmonale: Secondary | ICD-10-CM

## 2024-10-07 DIAGNOSIS — Z7901 Long term (current) use of anticoagulants: Secondary | ICD-10-CM

## 2024-10-10 NOTE — Telephone Encounter (Signed)
 Requested medications are due for refill today.  yes  Requested medications are on the active medications list.  yes  Last refill. 07/04/2023 #60 12 rf  Future visit scheduled.   no  Notes to clinic.  Labs are expired. Pt has missed a few recent appts.    Requested Prescriptions  Pending Prescriptions Disp Refills   ELIQUIS  5 MG TABS tablet [Pharmacy Med Name: ELIQUIS  5MG  TABLETS] 60 tablet 12    Sig: TAKE 1 TABLET(5 MG) BY MOUTH TWICE DAILY     Hematology:  Anticoagulants - apixaban  Failed - 10/10/2024  1:27 PM      Failed - AST in normal range and within 360 days    AST  Date Value Ref Range Status  06/28/2023 23 15 - 41 U/L Final         Failed - ALT in normal range and within 360 days    ALT  Date Value Ref Range Status  06/28/2023 13 0 - 44 U/L Final         Failed - Valid encounter within last 12 months    Recent Outpatient Visits           6 months ago Gastroesophageal reflux disease without esophagitis   [facility] [place], Marsa PARAS, DO              Passed - PLT in normal range and within 360 days    Platelets  Date Value Ref Range Status  05/12/2024 179 150 - 400 K/uL Final         Passed - HGB in normal range and within 360 days    Hemoglobin  Date Value Ref Range Status  05/12/2024 14.3 13.0 - 17.0 g/dL Final         Passed - HCT in normal range and within 360 days    HCT  Date Value Ref Range Status  05/12/2024 42.5 39.0 - 52.0 % Final         Passed - Cr in normal range and within 360 days    Creat  Date Value Ref Range Status  09/27/2022 1.19 0.70 - 1.30 mg/dL Final   Creatinine, Ser  Date Value Ref Range Status  05/12/2024 1.06 0.61 - 1.24 mg/dL Final

## 2024-11-04 ENCOUNTER — Ambulatory Visit
Admission: EM | Admit: 2024-11-04 | Discharge: 2024-11-04 | Disposition: A | Discharge status: Home / Self Care | Source: Home / Self Care | Attending: Emergency Medicine | Admitting: Emergency Medicine

## 2024-11-04 DIAGNOSIS — J0140 Acute pansinusitis, unspecified: Secondary | ICD-10-CM

## 2024-11-04 MED ORDER — PREDNISONE 20 MG PO TABS: 40.0000 mg | ORAL_TABLET | Freq: Every day | ORAL | 0 refills | Status: AC

## 2024-11-04 MED ORDER — AMOXICILLIN-POT CLAVULANATE 875-125 MG PO TABS: 1.0000 | ORAL_TABLET | Freq: Two times a day (BID) | ORAL | 0 refills | Status: AC

## 2024-11-04 MED ORDER — MOMETASONE FUROATE 50 MCG/ACT NA SUSP: 2.0000 | Freq: Every day | NASAL | 12 refills | Status: AC

## 2024-11-04 NOTE — Discharge Instructions (Signed)
 Start Mucinex  to keep the mucous thin and to decongest you.   You may take  1000 mg of tylenol  up to 3-4 times a day as needed for pain.  Nasonex nasal spray, prednisone 40 mg for 5 days.  Finish Augmentin , even if you feel better.  Use a NeilMed sinus rinse with distilled water as often as you want to to reduce nasal congestion. Follow the directions on the box.  Stop other cold medications.  Go to www.goodrx.com to look up your medications. This will give you a list of where you can find your prescriptions at the most affordable prices. Or you can ask the pharmacist what the cash price is. This is frequently cheaper than going through insurance.

## 2024-11-04 NOTE — ED Triage Notes (Signed)
 Sx x 1 week  Sinus pressure Headache Sore throat  Nasal congestion  post nasal drip  No fever

## 2024-11-04 NOTE — ED Provider Notes (Signed)
 " HPI  SUBJECTIVE:  Benjamin Delacruz is a 58 y.o. male who presents with 1 week of sinus pain and pressure, nasal congestion, yellowish rhinorrhea, postnasal drip, sore throat, upper dental pain.  He states that he is getting worse.  He reports epistaxis if he blows his nose too hard.  No cough, facial swelling.  No antibiotics in the past month.  Antipyretic in the past 6 hours.  He has been taking Tylenol , Sudafed, using an unknown prescription nasal spray and increasing fluids without improvement in his symptoms.  Symptoms are worse with standing up. Patient has past medical history of recurrent PE, DVT on Eliquis , MI status post 2 stents, hypercholesterolemia, hypertension.  [facility]   Past Medical History:  Diagnosis Date   DVT (deep venous thrombosis) (HCC)    Hypercholesteremia    MI (myocardial infarction) (HCC)    Pulmonary embolism (HCC)    Sleep apnea     Past Surgical History:  Procedure Laterality Date   APPENDECTOMY     CARDIAC STENTS     X2   COLONOSCOPY WITH PROPOFOL  N/A 08/09/2021   Procedure: COLONOSCOPY WITH PROPOFOL ;  Surgeon: Therisa Bi, MD;  Location: [facility] ENDOSCOPY;  Service: Gastroenterology;  Laterality: N/A;   IVC FILTER INSERTION     TONSILLECTOMY      Family History  Problem Relation Age of Onset   Diabetes Mother    Liver cancer Sister     Social History[1]  Current Medications[2]  Allergies[3]   ROS  As noted in HPI.   Physical Exam  BP (!) 164/99 (BP Location: Left Arm)   Pulse 69   Temp 98.4 F (36.9 C) (Oral)   Resp 18   Wt 111.1 kg   SpO2 100%   BMI 38.37 kg/m   Constitutional: Well developed, well nourished, no acute distress Eyes: PERRL, EOMI, conjunctiva normal bilaterally HENT: Normocephalic, atraumatic,mucus membranes moist.  Purulent nasal congestion.  Normal turbinates.  Exquisite maxillary, frontal sinus tenderness.  Positive nasal drip Respiratory: No respiratory effort Cardiovascular: Normal rate   GI: nondistended skin: No rash, skin intact Musculoskeletal: no deformities Neurologic: Alert & oriented x 3, CN III-XII grossly intact, no motor deficits, sensation grossly intact Psychiatric: Speech and behavior appropriate   ED Course   Medications - No data to display  No orders of the defined types were placed in this encounter.  No results found for this or any previous visit (from the past 24 hours). No results found.  ED Clinical Impression  1. Acute non-recurrent pansinusitis      ED Assessment/Plan    Patient presents for an acute pansinusitis.  He qualifies for antibiotics due to worsening symptoms.  Home with Mucinex , Augmentin , saline nasal irrigation, prednisone 40 mg for 5 days, Nasonex as ipratropium nasal spray is not working for him.  No Flonase as this tends to cause epistaxis. work note.  Follow-up with PCP as needed.   Discussed MDM, treatment plan, and plan for follow-up with patient patient agrees with plan.   Meds ordered this encounter  Medications   amoxicillin -clavulanate (AUGMENTIN ) 875-125 MG tablet    Sig: Take 1 tablet by mouth every 12 (twelve) hours.    Dispense:  14 tablet    Refill:  0   predniSONE (DELTASONE) 20 MG tablet    Sig: Take 2 tablets (40 mg total) by mouth daily with breakfast for 5 days.    Dispense:  10 tablet    Refill:  0   mometasone (NASONEX)  50 MCG/ACT nasal spray    Sig: Place 2 sprays into the nose daily.    Dispense:  1 each    Refill:  12      *This clinic note was created using Scientist, clinical (histocompatibility and immunogenetics). Therefore, there may be occasional mistakes despite careful proofreading. ?      [1]  Social History Tobacco Use   Smoking status: Never   Smokeless tobacco: Never  Vaping Use   Vaping status: Never Used  Substance Use Topics   Alcohol use: Not Currently   Drug use: Never  [2] No current facility-administered medications for this encounter.  Current Outpatient Medications:     amoxicillin -clavulanate (AUGMENTIN ) 875-125 MG tablet, Take 1 tablet by mouth every 12 (twelve) hours., Disp: 14 tablet, Rfl: 0   apixaban  (ELIQUIS ) 5 MG TABS tablet, TAKE 1 TABLET(5 MG) BY MOUTH TWICE DAILY, Disp: 60 tablet, Rfl: 2   atorvastatin  (LIPITOR) 20 MG tablet, Take 1 tablet (20 mg total) by mouth at bedtime., Disp: 90 tablet, Rfl: 3   carvedilol  (COREG ) 3.125 MG tablet, TAKE 1 TABLET(3.125 MG) BY MOUTH TWICE DAILY WITH A MEAL, Disp: 180 tablet, Rfl: 0   mometasone (NASONEX) 50 MCG/ACT nasal spray, Place 2 sprays into the nose daily., Disp: 1 each, Rfl: 12   predniSONE (DELTASONE) 20 MG tablet, Take 2 tablets (40 mg total) by mouth daily with breakfast for 5 days., Disp: 10 tablet, Rfl: 0   albuterol  (VENTOLIN  HFA) 108 (90 Base) MCG/ACT inhaler, Inhale 2 puffs into the lungs every 4 (four) hours as needed., Disp: 8 g, Rfl: 0   diclofenac  Sodium (VOLTAREN ) 1 % GEL, Apply 4 g topically 4 (four) times daily. To affected joint as needed for pain, Disp: 100 g, Rfl: 0   furosemide  (LASIX ) 20 MG tablet, Take 1-2 tablets (20-40 mg total) by mouth daily as needed for edema or fluid., Disp: 30 tablet, Rfl: 3   gabapentin  (NEURONTIN ) 300 MG capsule, Take 1 capsule (300 mg total) by mouth at bedtime., Disp: 30 capsule, Rfl: 2   latanoprost (XALATAN) 0.005 % ophthalmic solution, Place 1 drop into both eyes at bedtime., Disp: , Rfl:    omeprazole  (PRILOSEC) 40 MG capsule, Take 1 capsule (40 mg total) by mouth daily before breakfast., Disp: 30 capsule, Rfl: 3   ondansetron  (ZOFRAN -ODT) 8 MG disintegrating tablet, Take 1 tablet (8 mg total) by mouth every 8 (eight) hours as needed for nausea or vomiting., Disp: 20 tablet, Rfl: 0   sucralfate  (CARAFATE ) 1 g tablet, Take 1 tablet (1 g total) by mouth 4 (four) times daily -  with meals and at bedtime. As needed for acid reflux indigestion, Disp: 30 tablet, Rfl: 2 [3] No Known Allergies    Van Knee, MD 11/05/24 1051  "

## 2024-11-17 ENCOUNTER — Emergency Department

## 2024-11-17 ENCOUNTER — Emergency Department
Admission: EM | Admit: 2024-11-17 | Discharge: 2024-11-17 | Disposition: A | Discharge status: Home / Self Care | Attending: Emergency Medicine | Admitting: Emergency Medicine

## 2024-11-17 ENCOUNTER — Other Ambulatory Visit: Payer: Self-pay

## 2024-11-17 DIAGNOSIS — R0789 Other chest pain: Secondary | ICD-10-CM | POA: Insufficient documentation

## 2024-11-17 DIAGNOSIS — Z7901 Long term (current) use of anticoagulants: Secondary | ICD-10-CM | POA: Diagnosis not present

## 2024-11-17 HISTORY — DX: Essential (primary) hypertension: I10

## 2024-11-17 LAB — BASIC METABOLIC PANEL WITH GFR
Anion gap: 9 (ref 5–15)
BUN: 7 mg/dL (ref 6–20)
CO2: 22 mmol/L (ref 22–32)
Calcium: 9.1 mg/dL (ref 8.9–10.3)
Chloride: 109 mmol/L (ref 98–111)
Creatinine, Ser: 1.01 mg/dL (ref 0.61–1.24)
GFR, Estimated: >60 mL/min
Glucose, Bld: 129 mg/dL — ABNORMAL HIGH (ref 70–99)
Potassium: 4 mmol/L (ref 3.5–5.1)
Sodium: 140 mmol/L (ref 135–145)

## 2024-11-17 LAB — CBC
HCT: 41.2 % (ref 39.0–52.0)
Hemoglobin: 13.7 g/dL (ref 13.0–17.0)
MCH: 28.8 pg (ref 26.0–34.0)
MCHC: 33.3 g/dL (ref 30.0–36.0)
MCV: 86.6 fL (ref 80.0–100.0)
Platelets: 187 K/uL (ref 150–400)
RBC: 4.76 MIL/uL (ref 4.22–5.81)
RDW: 13.2 % (ref 11.5–15.5)
WBC: 7.3 K/uL (ref 4.0–10.5)
nRBC: 0 % (ref 0.0–0.2)

## 2024-11-17 LAB — TROPONIN T, HIGH SENSITIVITY: Troponin T High Sensitivity: <15 ng/L (ref 0–19)

## 2024-11-17 MED ORDER — IOHEXOL 350 MG/ML SOLN
75.0000 mL | Freq: Once | INTRAVENOUS | Status: AC | PRN
  Administered 2024-11-17: 75 mL via INTRAVENOUS

## 2024-11-17 MED ORDER — NAPROXEN 500 MG PO TABS: 500.0000 mg | ORAL_TABLET | Freq: Two times a day (BID) | ORAL | 2 refills | Status: AC

## 2024-11-17 NOTE — ED Provider Notes (Signed)
 "  [facility] Provider Note    Event Date/Time   First MD Initiated Contact with Patient 11/17/24 [phone]     (approximate)   History   Chest Pain   HPI  Benjamin Delacruz is a 59 y.o. male with a history of DVT, PE on Eliquis  who presents with complaints of right sided chest discomfort.  He is concerned because it feels like when he has had a PE in the past.  No fevers reported,     Physical Exam   Triage Vital Signs: ED Triage Vitals  Encounter Vitals Group     BP 11/17/24 0725 (!) 160/105     Girls Systolic BP Percentile --      Girls Diastolic BP Percentile --      Boys Systolic BP Percentile --      Boys Diastolic BP Percentile --      Pulse Rate 11/17/24 0725 76     Resp 11/17/24 0725 20     Temp 11/17/24 0725 98 F (36.7 C)     Temp src --      SpO2 11/17/24 0725 99 %     Weight 11/17/24 0726 111.1 kg (245 lb)     Height 11/17/24 0726 1.702 m (5' 7)     Head Circumference --      Peak Flow --      Pain Score 11/17/24 0726 7     Pain Loc --      Pain Education --      Exclude from Growth Chart --     Most recent vital signs: Vitals:   11/17/24 0725 11/17/24 1132  BP: (!) 160/105 (!) 154/94  Pulse: 76 74  Resp: 20 18  Temp: 98 F (36.7 C) 98 F (36.7 C)  SpO2: 99% 99%     General: Awake, no distress.  CV:  Good peripheral perfusion.  Regular rate Resp:  Normal effort.  Clear to auscultation bilaterally Abd:  No distention.  Other:  No calf pain or swelling   ED Results / Procedures / Treatments   Labs (all labs ordered are listed, but only abnormal results are displayed) Labs Reviewed  BASIC METABOLIC PANEL WITH GFR - Abnormal; Notable for the following components:      Result Value   Glucose, Bld 129 (*)    All other components within normal limits  CBC  TROPONIN T, HIGH SENSITIVITY     EKG  ED ECG REPORT I, Lamar Price, the attending physician, personally viewed and interpreted this ECG.  Date:  11/17/2024  Rhythm: normal sinus rhythm QRS Axis: normal Intervals: normal ST/T Wave abnormalities: normal Narrative Interpretation: no evidence of acute ischemia    RADIOLOGY Chest x-ray viewed interpret by me, no acute abnormality    PROCEDURES:  Critical Care performed:   Procedures   MEDICATIONS ORDERED IN ED: Medications  iohexol  (OMNIPAQUE ) 350 MG/ML injection 75 mL (75 mLs Intravenous Contrast Given 11/17/24 0926)     IMPRESSION / MDM / ASSESSMENT AND PLAN / ED COURSE  I reviewed the triage vital signs and the nursing notes. Patient's presentation is most consistent with acute presentation with potential threat to life or bodily function.  Patient presents with chest discomfort as detailed above, does report a component of pleurisy.  Reports compliance with his Eliquis .  Chest x-ray without evidence of pneumonia or pneumothorax.  Labs pending, will plan for CT angiography  Lab work is reassuring, normal high sensitive troponin  CT angiography is negative  for PE or pneumonia or pleural effusion.  Patient is greatly reassured by this, suspect musculoskeletal pain, will treat with NSAIDs, close outpatient follow-up recommended, return precautions discussed     FINAL CLINICAL IMPRESSION(S) / ED DIAGNOSES   Final diagnoses:  Chest wall pain     Rx / DC Orders   ED Discharge Orders          Ordered    naproxen  (NAPROSYN ) 500 MG tablet  2 times daily with meals        11/17/24 1129             Note:  This document was prepared using Dragon voice recognition software and may include unintentional dictation errors.   Arlander Charleston, MD 11/17/24 1133  "

## 2024-11-17 NOTE — ED Triage Notes (Signed)
 Pt to ED for right sided back/chest pain x 1week. Reports feels similar to PE in past. +shob Pt in NAD, RR even and unlabored
